# Patient Record
Sex: Female | Born: 1945 | Race: White | Hispanic: No | Marital: Married | Smoking: Former smoker
Health system: Southern US, Community
[De-identification: ages and names within clinical notes are randomized; demographics above are authoritative.]

## PROBLEM LIST (undated history)

## (undated) DIAGNOSIS — F988 Other specified behavioral and emotional disorders with onset usually occurring in childhood and adolescence: Secondary | ICD-10-CM

## (undated) HISTORY — PX: AUGMENTATION MAMMAPLASTY: SUR837

---

## 2000-11-23 ENCOUNTER — Encounter: Payer: Self-pay | Admitting: Internal Medicine

## 2000-11-23 ENCOUNTER — Encounter: Admission: RE | Admit: 2000-11-23 | Discharge: 2000-11-23 | Payer: Self-pay | Admitting: Internal Medicine

## 2003-04-08 ENCOUNTER — Encounter: Payer: Self-pay | Admitting: Plastic Surgery

## 2003-04-08 ENCOUNTER — Encounter: Admission: RE | Admit: 2003-04-08 | Discharge: 2003-04-08 | Payer: Self-pay | Admitting: Plastic Surgery

## 2003-08-21 ENCOUNTER — Ambulatory Visit (HOSPITAL_COMMUNITY): Admission: RE | Admit: 2003-08-21 | Discharge: 2003-08-21 | Payer: Self-pay | Admitting: Internal Medicine

## 2003-08-23 ENCOUNTER — Encounter: Admission: RE | Admit: 2003-08-23 | Discharge: 2003-08-23 | Payer: Self-pay | Admitting: Plastic Surgery

## 2005-01-05 ENCOUNTER — Ambulatory Visit (HOSPITAL_COMMUNITY): Admission: RE | Admit: 2005-01-05 | Discharge: 2005-01-05 | Payer: Self-pay | Admitting: Plastic Surgery

## 2005-08-23 ENCOUNTER — Encounter: Admission: RE | Admit: 2005-08-23 | Discharge: 2005-08-23 | Payer: Self-pay | Admitting: Internal Medicine

## 2006-09-16 ENCOUNTER — Encounter: Admission: RE | Admit: 2006-09-16 | Discharge: 2006-09-16 | Payer: Self-pay | Admitting: Internal Medicine

## 2007-12-08 ENCOUNTER — Encounter: Admission: RE | Admit: 2007-12-08 | Discharge: 2007-12-08 | Payer: Self-pay | Admitting: Internal Medicine

## 2008-11-11 ENCOUNTER — Encounter: Admission: RE | Admit: 2008-11-11 | Discharge: 2008-11-11 | Payer: Self-pay | Admitting: Internal Medicine

## 2008-12-11 ENCOUNTER — Encounter: Admission: RE | Admit: 2008-12-11 | Discharge: 2008-12-11 | Payer: Self-pay | Admitting: Internal Medicine

## 2009-04-21 ENCOUNTER — Ambulatory Visit: Payer: Self-pay | Admitting: Obstetrics and Gynecology

## 2009-04-21 ENCOUNTER — Encounter: Payer: Self-pay | Admitting: Obstetrics and Gynecology

## 2009-04-21 ENCOUNTER — Other Ambulatory Visit: Admission: RE | Admit: 2009-04-21 | Discharge: 2009-04-21 | Payer: Self-pay | Admitting: Obstetrics and Gynecology

## 2010-01-30 ENCOUNTER — Encounter: Admission: RE | Admit: 2010-01-30 | Discharge: 2010-01-30 | Payer: Self-pay | Admitting: Internal Medicine

## 2010-02-10 ENCOUNTER — Encounter: Admission: RE | Admit: 2010-02-10 | Discharge: 2010-02-10 | Payer: Self-pay | Admitting: Internal Medicine

## 2010-06-23 ENCOUNTER — Ambulatory Visit: Payer: Self-pay | Admitting: Obstetrics and Gynecology

## 2010-06-23 ENCOUNTER — Other Ambulatory Visit: Admission: RE | Admit: 2010-06-23 | Discharge: 2010-06-23 | Payer: Self-pay | Admitting: Obstetrics and Gynecology

## 2010-10-04 ENCOUNTER — Encounter: Payer: Self-pay | Admitting: Internal Medicine

## 2010-12-24 ENCOUNTER — Other Ambulatory Visit: Payer: Self-pay | Admitting: Dermatology

## 2011-03-31 ENCOUNTER — Other Ambulatory Visit: Payer: Self-pay | Admitting: Dermatology

## 2011-04-09 ENCOUNTER — Encounter (INDEPENDENT_AMBULATORY_CARE_PROVIDER_SITE_OTHER): Payer: Self-pay

## 2011-06-11 ENCOUNTER — Other Ambulatory Visit: Payer: Self-pay | Admitting: Internal Medicine

## 2011-06-11 DIAGNOSIS — Z1231 Encounter for screening mammogram for malignant neoplasm of breast: Secondary | ICD-10-CM

## 2011-07-26 ENCOUNTER — Ambulatory Visit: Payer: Self-pay

## 2011-10-28 DIAGNOSIS — J069 Acute upper respiratory infection, unspecified: Secondary | ICD-10-CM | POA: Diagnosis not present

## 2011-11-04 ENCOUNTER — Ambulatory Visit
Admission: RE | Admit: 2011-11-04 | Discharge: 2011-11-04 | Disposition: A | Discharge status: Home / Self Care | Payer: Medicare Other | Source: Ambulatory Visit | Attending: Internal Medicine | Admitting: Internal Medicine

## 2011-11-04 DIAGNOSIS — Z1231 Encounter for screening mammogram for malignant neoplasm of breast: Secondary | ICD-10-CM

## 2011-11-19 DIAGNOSIS — M899 Disorder of bone, unspecified: Secondary | ICD-10-CM | POA: Diagnosis not present

## 2011-11-19 DIAGNOSIS — R82998 Other abnormal findings in urine: Secondary | ICD-10-CM | POA: Diagnosis not present

## 2011-11-19 DIAGNOSIS — M949 Disorder of cartilage, unspecified: Secondary | ICD-10-CM | POA: Diagnosis not present

## 2011-11-19 DIAGNOSIS — Z0000 Encounter for general adult medical examination without abnormal findings: Secondary | ICD-10-CM | POA: Diagnosis not present

## 2011-11-26 DIAGNOSIS — Z0000 Encounter for general adult medical examination without abnormal findings: Secondary | ICD-10-CM | POA: Diagnosis not present

## 2011-11-26 DIAGNOSIS — G4700 Insomnia, unspecified: Secondary | ICD-10-CM | POA: Diagnosis not present

## 2011-11-26 DIAGNOSIS — F988 Other specified behavioral and emotional disorders with onset usually occurring in childhood and adolescence: Secondary | ICD-10-CM | POA: Diagnosis not present

## 2011-11-26 DIAGNOSIS — M899 Disorder of bone, unspecified: Secondary | ICD-10-CM | POA: Diagnosis not present

## 2011-11-26 DIAGNOSIS — Z23 Encounter for immunization: Secondary | ICD-10-CM | POA: Diagnosis not present

## 2011-12-03 DIAGNOSIS — Z1212 Encounter for screening for malignant neoplasm of rectum: Secondary | ICD-10-CM | POA: Diagnosis not present

## 2012-05-12 ENCOUNTER — Encounter: Payer: Self-pay | Admitting: Gynecology

## 2012-05-17 ENCOUNTER — Other Ambulatory Visit: Payer: Self-pay | Admitting: Dermatology

## 2012-05-17 DIAGNOSIS — D2370 Other benign neoplasm of skin of unspecified lower limb, including hip: Secondary | ICD-10-CM | POA: Diagnosis not present

## 2012-05-17 DIAGNOSIS — L570 Actinic keratosis: Secondary | ICD-10-CM | POA: Diagnosis not present

## 2012-05-17 DIAGNOSIS — D485 Neoplasm of uncertain behavior of skin: Secondary | ICD-10-CM | POA: Diagnosis not present

## 2012-05-17 DIAGNOSIS — L821 Other seborrheic keratosis: Secondary | ICD-10-CM | POA: Diagnosis not present

## 2012-05-17 DIAGNOSIS — D239 Other benign neoplasm of skin, unspecified: Secondary | ICD-10-CM | POA: Diagnosis not present

## 2012-05-17 DIAGNOSIS — B079 Viral wart, unspecified: Secondary | ICD-10-CM | POA: Diagnosis not present

## 2012-05-18 ENCOUNTER — Encounter: Payer: Self-pay | Admitting: Obstetrics and Gynecology

## 2012-05-18 ENCOUNTER — Ambulatory Visit (INDEPENDENT_AMBULATORY_CARE_PROVIDER_SITE_OTHER): Payer: Medicare Other | Admitting: Obstetrics and Gynecology

## 2012-05-18 VITALS — BP 120/74 | Ht 63.0 in | Wt 130.0 lb

## 2012-05-18 DIAGNOSIS — Z780 Asymptomatic menopausal state: Secondary | ICD-10-CM | POA: Diagnosis not present

## 2012-05-18 DIAGNOSIS — N952 Postmenopausal atrophic vaginitis: Secondary | ICD-10-CM | POA: Diagnosis not present

## 2012-05-18 DIAGNOSIS — K649 Unspecified hemorrhoids: Secondary | ICD-10-CM

## 2012-05-18 NOTE — Progress Notes (Signed)
Patient came back to see me today for further followup. We have been watching her with menopausal symptoms. At the moment she is not taking systemic HRT and really has not having significant vasomotor symptoms which require it. She continues to have some trouble with vaginal dryness but is not using estrogen cream regularly that we've given her. She has been on estrace. There having intercourse several times a week hence she says she gets by without it but she is definitely drier then she would like. She is having no pelvic pain. She does not have vaginal dryness. Her husband is on Viagra now due to ED with hypertensive drugs. She thinks he is not as hard as he used to be and therefore she is having less dyspareunia. She has never had an abnormal Pap smear. She has them yearly. Her last Pap was October, 2011. She is up-to-date on mammograms. She does her  bone densities through her PCP. We have watched her with a hemorrhoid that she has used analpram- HC cream with. She is currently asymptomatic. She is having no rectal bleeding.she does not have any dysuria, urinary urgency, or incontinence.  ROS: 12 system review done. Pertinent positives above.  Physical examination:Kerri Osborne present. HEENT within normal limits. Neck: Thyroid not large. No masses. Supraclavicular nodes: not enlarged. Breasts: Examined in both sitting and lying  position. No skin changes and no masses. Abdomen: Soft no guarding rebound or masses or hernia. Pelvic: External: Within normal limits. BUS: Within normal limits. Vaginal:within normal limits. Poor  estrogen effect. No evidence of cystocele rectocele or enterocele. Cervix: clean. Uterus: Normal size and shape. Adnexa: No masses. Rectovaginal exam: Confirmatory and negative. Extremities: Within normal limits.  Assessment: #1. Atrophic vaginitis #2. Mild menopausal symptoms #3. Asymptomatic hemorrhoid.  Plan: Continue yearly mammograms. Continue periodic bone densities.  Discussed osphenia but for the moment patient will continue Estrace cream.The new Pap smear guidelines were discussed with the patient. No Pap  Done.

## 2012-05-18 NOTE — Patient Instructions (Signed)
Continue yearly mammograms 

## 2012-06-26 ENCOUNTER — Encounter: Payer: Self-pay | Admitting: Gastroenterology

## 2012-09-13 HISTORY — PX: CARPAL TUNNEL RELEASE: SHX101

## 2012-09-13 HISTORY — PX: HALLUX VALGUS CORRECTION: SUR315

## 2012-09-22 DIAGNOSIS — L608 Other nail disorders: Secondary | ICD-10-CM | POA: Diagnosis not present

## 2012-09-22 DIAGNOSIS — D485 Neoplasm of uncertain behavior of skin: Secondary | ICD-10-CM | POA: Diagnosis not present

## 2012-12-05 DIAGNOSIS — L259 Unspecified contact dermatitis, unspecified cause: Secondary | ICD-10-CM | POA: Diagnosis not present

## 2012-12-08 DIAGNOSIS — M949 Disorder of cartilage, unspecified: Secondary | ICD-10-CM | POA: Diagnosis not present

## 2012-12-08 DIAGNOSIS — M899 Disorder of bone, unspecified: Secondary | ICD-10-CM | POA: Diagnosis not present

## 2012-12-15 DIAGNOSIS — Z0000 Encounter for general adult medical examination without abnormal findings: Secondary | ICD-10-CM | POA: Diagnosis not present

## 2012-12-15 DIAGNOSIS — M5430 Sciatica, unspecified side: Secondary | ICD-10-CM | POA: Diagnosis not present

## 2012-12-15 DIAGNOSIS — Z1331 Encounter for screening for depression: Secondary | ICD-10-CM | POA: Diagnosis not present

## 2012-12-15 DIAGNOSIS — E559 Vitamin D deficiency, unspecified: Secondary | ICD-10-CM | POA: Diagnosis not present

## 2012-12-15 DIAGNOSIS — M899 Disorder of bone, unspecified: Secondary | ICD-10-CM | POA: Diagnosis not present

## 2012-12-15 DIAGNOSIS — G4700 Insomnia, unspecified: Secondary | ICD-10-CM | POA: Diagnosis not present

## 2012-12-15 DIAGNOSIS — M949 Disorder of cartilage, unspecified: Secondary | ICD-10-CM | POA: Diagnosis not present

## 2012-12-15 DIAGNOSIS — F988 Other specified behavioral and emotional disorders with onset usually occurring in childhood and adolescence: Secondary | ICD-10-CM | POA: Diagnosis not present

## 2012-12-15 DIAGNOSIS — G5600 Carpal tunnel syndrome, unspecified upper limb: Secondary | ICD-10-CM | POA: Diagnosis not present

## 2012-12-26 DIAGNOSIS — Z1212 Encounter for screening for malignant neoplasm of rectum: Secondary | ICD-10-CM | POA: Diagnosis not present

## 2012-12-29 ENCOUNTER — Other Ambulatory Visit: Payer: Self-pay

## 2012-12-29 DIAGNOSIS — Z1231 Encounter for screening mammogram for malignant neoplasm of breast: Secondary | ICD-10-CM

## 2012-12-29 DIAGNOSIS — Z9882 Breast implant status: Secondary | ICD-10-CM

## 2013-01-15 ENCOUNTER — Encounter: Payer: Self-pay | Admitting: Gastroenterology

## 2013-01-22 ENCOUNTER — Ambulatory Visit: Payer: Medicare Other

## 2013-01-23 DIAGNOSIS — M899 Disorder of bone, unspecified: Secondary | ICD-10-CM | POA: Diagnosis not present

## 2013-02-08 ENCOUNTER — Ambulatory Visit: Payer: Medicare Other

## 2013-02-09 DIAGNOSIS — G5600 Carpal tunnel syndrome, unspecified upper limb: Secondary | ICD-10-CM | POA: Diagnosis not present

## 2013-02-22 DIAGNOSIS — G5600 Carpal tunnel syndrome, unspecified upper limb: Secondary | ICD-10-CM | POA: Diagnosis not present

## 2013-02-26 ENCOUNTER — Ambulatory Visit
Admission: RE | Admit: 2013-02-26 | Discharge: 2013-02-26 | Disposition: A | Discharge status: Home / Self Care | Payer: Medicare Other | Source: Ambulatory Visit

## 2013-02-26 DIAGNOSIS — Z9882 Breast implant status: Secondary | ICD-10-CM

## 2013-02-26 DIAGNOSIS — Z1231 Encounter for screening mammogram for malignant neoplasm of breast: Secondary | ICD-10-CM | POA: Diagnosis not present

## 2013-03-08 ENCOUNTER — Encounter (HOSPITAL_COMMUNITY): Payer: Self-pay | Admitting: Emergency Medicine

## 2013-03-08 ENCOUNTER — Emergency Department (HOSPITAL_COMMUNITY)
Admission: EM | Admit: 2013-03-08 | Discharge: 2013-03-08 | Disposition: A | Discharge status: Home / Self Care | Payer: Medicare Other | Attending: Emergency Medicine | Admitting: Emergency Medicine

## 2013-03-08 ENCOUNTER — Emergency Department (HOSPITAL_COMMUNITY): Payer: Medicare Other

## 2013-03-08 DIAGNOSIS — Z01810 Encounter for preprocedural cardiovascular examination: Secondary | ICD-10-CM | POA: Diagnosis not present

## 2013-03-08 DIAGNOSIS — S90859A Superficial foreign body, unspecified foot, initial encounter: Secondary | ICD-10-CM | POA: Diagnosis not present

## 2013-03-08 DIAGNOSIS — L089 Local infection of the skin and subcutaneous tissue, unspecified: Secondary | ICD-10-CM | POA: Insufficient documentation

## 2013-03-08 DIAGNOSIS — Z8669 Personal history of other diseases of the nervous system and sense organs: Secondary | ICD-10-CM | POA: Insufficient documentation

## 2013-03-08 DIAGNOSIS — Y929 Unspecified place or not applicable: Secondary | ICD-10-CM | POA: Insufficient documentation

## 2013-03-08 DIAGNOSIS — F909 Attention-deficit hyperactivity disorder, unspecified type: Secondary | ICD-10-CM | POA: Insufficient documentation

## 2013-03-08 DIAGNOSIS — Z87891 Personal history of nicotine dependence: Secondary | ICD-10-CM | POA: Insufficient documentation

## 2013-03-08 DIAGNOSIS — Y9301 Activity, walking, marching and hiking: Secondary | ICD-10-CM | POA: Insufficient documentation

## 2013-03-08 DIAGNOSIS — W268XXA Contact with other sharp object(s), not elsewhere classified, initial encounter: Secondary | ICD-10-CM | POA: Insufficient documentation

## 2013-03-08 DIAGNOSIS — M79609 Pain in unspecified limb: Secondary | ICD-10-CM | POA: Diagnosis not present

## 2013-03-08 LAB — CBC
HCT: 49.4 % — ABNORMAL HIGH (ref 36.0–46.0)
Hemoglobin: 13.3 g/dL (ref 12.0–15.0)
MCHC: 26.9 g/dL — ABNORMAL LOW (ref 30.0–36.0)
Platelets: 274 10*3/uL (ref 150–400)
RBC: 4.26 MIL/uL (ref 3.87–5.11)

## 2013-03-08 LAB — BASIC METABOLIC PANEL: GFR calc non Af Amer: >90 mL/min (ref >90)

## 2013-03-08 MED ORDER — CLINDAMYCIN HCL 150 MG PO CAPS: 300.0000 mg | ORAL_CAPSULE | Freq: Four times a day (QID) | ORAL | Status: DC

## 2013-03-08 MED ORDER — CLINDAMYCIN HCL 300 MG PO CAPS
450.0000 mg | ORAL_CAPSULE | Freq: Once | ORAL | Status: AC
  Administered 2013-03-08: 450 mg via ORAL
  Filled 2013-03-08: qty 1

## 2013-03-08 MED ORDER — HYDROCODONE-ACETAMINOPHEN 5-325 MG PO TABS: 1.0000 | ORAL_TABLET | Freq: Four times a day (QID) | ORAL | Status: DC | PRN

## 2013-03-08 NOTE — ED Provider Notes (Signed)
History    CSN: [id number] Arrival date & time 03/08/13  4098 First MD Initiated Contact with Patient 03/08/13 1854     Chief Complaint  Patient presents with  . Toe Injury   HPI  Patient walking across carpet and dirty/rusty carpet tack or needle went into her right great toe last night. Patient with redness expanding up toe today and with moderate pain with walking and severe pain if she pushes on the area. Described as sharp pain. No radiation of pain. Pain improved if area not being pressed on.  Can move toe. No numbness/tingling of toe. No fevers/chills/nausea/vomiting  Past medical history-ADHD, carpal tunnel  Past Surgical History  Procedure Laterality Date  . Cesarean section      X 2  . Augmentation mammaplasty      Implants and implant removal  . Carpal tunnel release     Family History  Problem Relation Age of Onset  . Diabetes Mother    History  Substance Use Topics  . Smoking status: Former Games developer  . Smokeless tobacco: Not on file  . Alcohol Use: 2.5 oz/week    5 drink(s) per week   OB History   Grav Para Term Preterm Abortions TAB SAB Ect Mult Living   2 2 2       2      Review of Systems A full 10 point review of symptoms was performed and was negative except as noted in HPI.   Allergies  Review of patient's allergies indicates no known allergies.  Home Medications   Current Outpatient Rx  Name  Route  Sig  Dispense  Refill  . amphetamine-dextroamphetamine (ADDERALL) 30 MG tablet   Oral   Take 30 mg by mouth every morning.          . calcium-vitamin D (OSCAL WITH D) 500-200 MG-UNIT per tablet   Oral   Take 1 tablet by mouth every morning.         . Cholecalciferol (VITAMIN D PO)   Oral   Take 1 tablet by mouth every morning.          . fish oil-omega-3 fatty acids 1000 MG capsule   Oral   Take 1 g by mouth every morning.         . Multiple Vitamin (MULTIVITAMIN) tablet   Oral   Take 1 tablet by mouth every morning.           . vitamin E 100 UNIT capsule   Oral   Take 100 Units by mouth every morning.          BP 120/76  Pulse 76  Temp(Src) 98 F (36.7 C) (Oral)  Resp 14  Ht 5\' 3"  (1.6 m)  Wt 127 lb (57.607 kg)  BMI 22.5 kg/m2  SpO2 99% Physical Exam  Constitutional: She is oriented to person, place, and time. She appears well-developed and well-nourished.  HENT:  Head: Normocephalic and atraumatic.  Eyes: EOM are normal. Pupils are equal, round, and reactive to light.  Neck: Normal range of motion. Neck supple.  Cardiovascular: Normal rate and regular rhythm.   Pulmonary/Chest: Effort normal and breath sounds normal.  Abdominal: Soft. Bowel sounds are normal.  Musculoskeletal:  Full ROM of great toe. Pinpoint entry wound on medial aspect of right first toe. Very tender to palpation. Surrounding erythema up to web between toes 1+2.   Neurological: She is alert and oriented to person, place, and time. She exhibits normal muscle tone. Coordination normal.  Skin: Skin is warm and dry.    ED Course  Procedures (including critical care time) Labs Reviewed  CBC - Abnormal; Notable for the following:    HCT 49.4 (*)    MCV 116.0 (*)    MCHC 26.9 (*)    All other components within normal limits  BASIC METABOLIC PANEL   Dg Toe Great Right  03/08/2013   *RADIOLOGY REPORT*  Clinical Data: Toe pain, evaluate for foreign body  RIGHT GREAT TOE  Comparison: None.  Findings: 5.5 mm linear metallic radiopacity consistent with the tip of a pin or needle within the medial plantar soft tissues underlying the mid aspect of the distal phalanx of the great toe. The tip of the needle contacts and may be imbedded within the phalanx.  There is associated diffuse soft tissue swelling of the great toe.  The remainder the visualized bones and joints are unremarkable.  IMPRESSION:  A 5.5 mm metallic density consistent with the tip of a pain or needle is imbedded within the medial plantar soft tissues underlying the mid  aspect of the distal phalanx of the great toe.  The tip of the foreign body contacts and may be imbedded within the phalanx.   Original Report Authenticated By: Malachy Moan, M.D.   1. Foreign body in foot/toe-infected, left, initial encounter    MDM  Infected right great toe after rusty nail/pin entered toe with foreign body still in toe. Tetanus up to date (discussed this month with PCP but doesn't remember specific date). Discussed with Dr. Dion Saucier of orthopedics by phone and unable to remove tonight. He will have patient follow up in AM at day surgery center. Patient to take clindamycin for antibiotic coverage. Vicodin prn for pain.   Shelva Majestic, MD 03/08/13 2156

## 2013-03-08 NOTE — ED Notes (Addendum)
Patient reports that last night a pin broke off in her right great toe while she was walking. Affected toe is swollen, red, and hot to touch. No loss of sensation reported, capillary refill is brisk. Patient visited urgent care 1 hour ago and has an x-ray at bedside from the visit.

## 2013-03-08 NOTE — ED Provider Notes (Signed)
I saw and evaluated the patient, reviewed the resident's note and I agree with the findings and plan.   Date: 03/08/2013  Rate: 78  Rhythm: normal sinus rhythm  QRS Axis: normal  Intervals: normal  ST/T Wave abnormalities: normal  Conduction Disutrbances: none  Narrative Interpretation:   Old EKG Reviewed: No significant changes noted  Patient with developing infection in her toe secondary to foreign body.  I discussed the case with orthopedic surgery who will likely operate on the patient tomorrow.  The patient is to be n.p.o. after midnight.  The patient will see Dr Dion Saucier, the surgeon at the Surgical center tomorrow.  Labs and EKG for preop purposes.  Home with antibiotics     Lyanne Co, MD 03/08/13 2336

## 2013-03-09 ENCOUNTER — Encounter (HOSPITAL_BASED_OUTPATIENT_CLINIC_OR_DEPARTMENT_OTHER): Payer: Self-pay | Admitting: Anesthesiology

## 2013-03-09 ENCOUNTER — Ambulatory Visit (HOSPITAL_BASED_OUTPATIENT_CLINIC_OR_DEPARTMENT_OTHER): Payer: Medicare Other | Admitting: Anesthesiology

## 2013-03-09 ENCOUNTER — Encounter (HOSPITAL_BASED_OUTPATIENT_CLINIC_OR_DEPARTMENT_OTHER): Payer: Self-pay | Admitting: *Deleted

## 2013-03-09 ENCOUNTER — Encounter (HOSPITAL_BASED_OUTPATIENT_CLINIC_OR_DEPARTMENT_OTHER)
Admission: RE | Disposition: A | Discharge status: Home / Self Care | Payer: Self-pay | Source: Ambulatory Visit | Attending: Orthopedic Surgery

## 2013-03-09 ENCOUNTER — Ambulatory Visit (HOSPITAL_BASED_OUTPATIENT_CLINIC_OR_DEPARTMENT_OTHER)
Admission: RE | Admit: 2013-03-09 | Discharge: 2013-03-09 | Disposition: A | Discharge status: Home / Self Care | Payer: Medicare Other | Source: Ambulatory Visit | Attending: Orthopedic Surgery | Admitting: Orthopedic Surgery

## 2013-03-09 DIAGNOSIS — S91109A Unspecified open wound of unspecified toe(s) without damage to nail, initial encounter: Secondary | ICD-10-CM | POA: Diagnosis not present

## 2013-03-09 DIAGNOSIS — S90454A Superficial foreign body, right lesser toe(s), initial encounter: Secondary | ICD-10-CM | POA: Diagnosis present

## 2013-03-09 DIAGNOSIS — IMO0002 Reserved for concepts with insufficient information to code with codable children: Secondary | ICD-10-CM | POA: Diagnosis not present

## 2013-03-09 DIAGNOSIS — Z1810 Retained metal fragments, unspecified: Secondary | ICD-10-CM | POA: Diagnosis not present

## 2013-03-09 DIAGNOSIS — S91309A Unspecified open wound, unspecified foot, initial encounter: Secondary | ICD-10-CM | POA: Diagnosis not present

## 2013-03-09 DIAGNOSIS — X58XXXA Exposure to other specified factors, initial encounter: Secondary | ICD-10-CM | POA: Insufficient documentation

## 2013-03-09 DIAGNOSIS — M795 Residual foreign body in soft tissue: Secondary | ICD-10-CM | POA: Insufficient documentation

## 2013-03-09 HISTORY — DX: Other specified behavioral and emotional disorders with onset usually occurring in childhood and adolescence: F98.8

## 2013-03-09 HISTORY — PX: FOREIGN BODY REMOVAL: SHX962

## 2013-03-09 LAB — POCT HEMOGLOBIN-HEMACUE: Hemoglobin: 14.2 g/dL (ref 12.0–15.0)

## 2013-03-09 SURGERY — FOREIGN BODY REMOVAL ADULT
Anesthesia: Monitor Anesthesia Care | Site: Toe | Laterality: Right | Wound class: Dirty or Infected

## 2013-03-09 MED ORDER — MIDAZOLAM HCL 2 MG/2ML IJ SOLN: 0.5000 mg | INTRAMUSCULAR | Status: DC | PRN

## 2013-03-09 MED ORDER — OXYCODONE HCL 5 MG PO TABS: 5.0000 mg | ORAL_TABLET | Freq: Once | ORAL | Status: DC | PRN

## 2013-03-09 MED ORDER — HYDROMORPHONE HCL PF 1 MG/ML IJ SOLN: 0.2500 mg | INTRAMUSCULAR | Status: DC | PRN

## 2013-03-09 MED ORDER — MIDAZOLAM HCL 5 MG/5ML IJ SOLN
INTRAMUSCULAR | Status: DC | PRN
  Administered 2013-03-09: 2 mg via INTRAVENOUS

## 2013-03-09 MED ORDER — LACTATED RINGERS IV SOLN
INTRAVENOUS | Status: DC
  Administered 2013-03-09: 09:00:00 via INTRAVENOUS

## 2013-03-09 MED ORDER — BUPIVACAINE HCL (PF) 0.5 % IJ SOLN
INTRAMUSCULAR | Status: DC | PRN
  Administered 2013-03-09: 10 mL

## 2013-03-09 MED ORDER — FENTANYL CITRATE 0.05 MG/ML IJ SOLN
INTRAMUSCULAR | Status: DC | PRN
  Administered 2013-03-09: 100 ug via INTRAVENOUS

## 2013-03-09 MED ORDER — FENTANYL CITRATE 0.05 MG/ML IJ SOLN: 50.0000 ug | INTRAMUSCULAR | Status: DC | PRN

## 2013-03-09 MED ORDER — PROPOFOL INFUSION 10 MG/ML OPTIME
INTRAVENOUS | Status: DC | PRN
  Administered 2013-03-09: 125 ug/kg/min via INTRAVENOUS

## 2013-03-09 MED ORDER — OXYCODONE HCL 5 MG/5ML PO SOLN: 5.0000 mg | Freq: Once | ORAL | Status: DC | PRN

## 2013-03-09 MED ORDER — ONDANSETRON HCL 4 MG/2ML IJ SOLN
INTRAMUSCULAR | Status: DC | PRN
  Administered 2013-03-09: 4 mg via INTRAVENOUS

## 2013-03-09 MED ORDER — CEFAZOLIN SODIUM-DEXTROSE 2-3 GM-% IV SOLR
INTRAVENOUS | Status: DC | PRN
  Administered 2013-03-09: 2 g via INTRAVENOUS

## 2013-03-09 SURGICAL SUPPLY — 76 items
APL SKNCLS STERI-STRIP NONHPOA (GAUZE/BANDAGES/DRESSINGS)
BANDAGE CONFORM 2  STR LF (GAUZE/BANDAGES/DRESSINGS) ×1 IMPLANT
BANDAGE ELASTIC 3 VELCRO ST LF (GAUZE/BANDAGES/DRESSINGS) ×1 IMPLANT
BANDAGE ELASTIC 4 VELCRO ST LF (GAUZE/BANDAGES/DRESSINGS) IMPLANT
BANDAGE GAUZE ELAST BULKY 4 IN (GAUZE/BANDAGES/DRESSINGS) IMPLANT
BENZOIN TINCTURE PRP APPL 2/3 (GAUZE/BANDAGES/DRESSINGS) IMPLANT
BLADE MINI RND TIP GREEN BEAV (BLADE) IMPLANT
BLADE SURG 15 STRL LF DISP TIS (BLADE) ×1 IMPLANT
BLADE SURG 15 STRL SS (BLADE) ×2
BNDG CMPR 9X4 STRL LF SNTH (GAUZE/BANDAGES/DRESSINGS)
BNDG CMPR MD 5X2 ELC HKLP STRL (GAUZE/BANDAGES/DRESSINGS)
BNDG COHESIVE 4X5 TAN STRL (GAUZE/BANDAGES/DRESSINGS) IMPLANT
BNDG ELASTIC 2 VLCR STRL LF (GAUZE/BANDAGES/DRESSINGS) IMPLANT
BNDG ESMARK 4X9 LF (GAUZE/BANDAGES/DRESSINGS) ×1 IMPLANT
CLOTH BEACON ORANGE TIMEOUT ST (SAFETY) ×2 IMPLANT
CORDS BIPOLAR (ELECTRODE) IMPLANT
COVER TABLE BACK 60X90 (DRAPES) ×2 IMPLANT
CUFF TOURNIQUET SINGLE 18IN (TOURNIQUET CUFF) IMPLANT
CUFF TOURNIQUET SINGLE 34IN LL (TOURNIQUET CUFF) IMPLANT
DECANTER SPIKE VIAL GLASS SM (MISCELLANEOUS) IMPLANT
DRAPE EXTREMITY T 121X128X90 (DRAPE) ×2 IMPLANT
DRAPE OEC MINIVIEW 54X84 (DRAPES) IMPLANT
DRAPE U 20/CS (DRAPES) ×2 IMPLANT
DURAPREP 26ML APPLICATOR (WOUND CARE) ×2 IMPLANT
ELECT REM PT RETURN 9FT ADLT (ELECTROSURGICAL)
ELECTRODE REM PT RTRN 9FT ADLT (ELECTROSURGICAL) ×1 IMPLANT
GAUZE XEROFORM 1X8 LF (GAUZE/BANDAGES/DRESSINGS) ×1 IMPLANT
GLOVE BIO SURGEON STRL SZ7 (GLOVE) ×1 IMPLANT
GLOVE BIO SURGEON STRL SZ7.5 (GLOVE) IMPLANT
GLOVE BIOGEL PI IND STRL 7.0 (GLOVE) IMPLANT
GLOVE BIOGEL PI IND STRL 8 (GLOVE) ×2 IMPLANT
GLOVE BIOGEL PI INDICATOR 7.0 (GLOVE) ×2
GLOVE BIOGEL PI INDICATOR 8 (GLOVE) ×2
GLOVE ORTHO TXT STRL SZ7.5 (GLOVE) ×2 IMPLANT
GLOVE SURG ORTHO 8.0 STRL STRW (GLOVE) ×2 IMPLANT
GLOVE SURG SS PI 7.0 STRL IVOR (GLOVE) ×1 IMPLANT
GOWN BRE IMP PREV XXLGXLNG (GOWN DISPOSABLE) ×4 IMPLANT
NDL HYPO 25X1 1.5 SAFETY (NEEDLE) IMPLANT
NEEDLE HYPO 25X1 1.5 SAFETY (NEEDLE) ×2 IMPLANT
NS IRRIG 1000ML POUR BTL (IV SOLUTION) ×2 IMPLANT
PACK BASIN DAY SURGERY FS (CUSTOM PROCEDURE TRAY) ×2 IMPLANT
PAD CAST 3X4 CTTN HI CHSV (CAST SUPPLIES) IMPLANT
PAD CAST 4YDX4 CTTN HI CHSV (CAST SUPPLIES) IMPLANT
PADDING CAST ABS 4INX4YD NS (CAST SUPPLIES) ×1
PADDING CAST ABS COTTON 4X4 ST (CAST SUPPLIES) ×1 IMPLANT
PADDING CAST COTTON 3X4 STRL (CAST SUPPLIES)
PADDING CAST COTTON 4X4 STRL (CAST SUPPLIES)
PADDING CAST SYN 6 (CAST SUPPLIES)
PADDING CAST SYNTHETIC 2 (CAST SUPPLIES)
PADDING CAST SYNTHETIC 2X4 NS (CAST SUPPLIES) IMPLANT
PADDING CAST SYNTHETIC 3 NS LF (CAST SUPPLIES)
PADDING CAST SYNTHETIC 3X4 NS (CAST SUPPLIES) IMPLANT
PADDING CAST SYNTHETIC 4 (CAST SUPPLIES)
PADDING CAST SYNTHETIC 4X4 STR (CAST SUPPLIES) IMPLANT
PADDING CAST SYNTHETIC 6X4 NS (CAST SUPPLIES) IMPLANT
PADDING UNDERCAST 2  STERILE (CAST SUPPLIES) IMPLANT
PENCIL BUTTON HOLSTER BLD 10FT (ELECTRODE) IMPLANT
SCOTCHCAST PLUS 2X4 WHITE (CAST SUPPLIES) IMPLANT
SCOTCHCAST PLUS 3X4 WHITE (CAST SUPPLIES) IMPLANT
SCOTCHCAST PLUS 4X4 WHITE (CAST SUPPLIES) IMPLANT
SCOTCHCAST PLUS 5X4 WHITE (CAST SUPPLIES) IMPLANT
SPONGE GAUZE 4X4 12PLY (GAUZE/BANDAGES/DRESSINGS) ×2 IMPLANT
STOCKINETTE 4X48 STRL (DRAPES) IMPLANT
STOCKINETTE 6  STRL (DRAPES) ×1
STOCKINETTE 6 STRL (DRAPES) IMPLANT
STRIP CLOSURE SKIN 1/2X4 (GAUZE/BANDAGES/DRESSINGS) IMPLANT
SUT ETHILON 3 0 PS 1 (SUTURE) ×1 IMPLANT
SUT ETHILON 4 0 PS 2 18 (SUTURE) IMPLANT
SUT MNCRL AB 4-0 PS2 18 (SUTURE) IMPLANT
SUT VIC AB 3-0 SH 27 (SUTURE)
SUT VIC AB 3-0 SH 27X BRD (SUTURE) IMPLANT
SUT VICRYL 3-0 CR8 SH (SUTURE) ×1 IMPLANT
SYR BULB 3OZ (MISCELLANEOUS) ×2 IMPLANT
SYR CONTROL 10ML LL (SYRINGE) ×1 IMPLANT
TOWEL OR 17X24 6PK STRL BLUE (TOWEL DISPOSABLE) ×2 IMPLANT
UNDERPAD 30X30 INCONTINENT (UNDERPADS AND DIAPERS) ×2 IMPLANT

## 2013-03-09 NOTE — Anesthesia Postprocedure Evaluation (Signed)
  Anesthesia Post-op Note  Patient: Kerri Osborne  Procedure(s) Performed: Procedure(s): FOREIGN BODY REMOVAL ADULT I and  D abcess Right great toe  (Right)  Patient Location: PACU  Anesthesia Type:MAC  Level of Consciousness: awake, alert  and oriented  Airway and Oxygen Therapy: Patient Spontanous Breathing  Post-op Pain: none  Post-op Assessment: Post-op Vital signs reviewed, Patient's Cardiovascular Status Stable, Respiratory Function Stable, Patent Airway and No signs of Nausea or vomiting  Post-op Vital Signs: Reviewed and stable  Complications: No apparent anesthesia complications

## 2013-03-09 NOTE — H&P (Signed)
PREOPERATIVE H&P  Chief Complaint: peice of metal in great right toe   HPI: Kerri Osborne is a 67 y.o. female who presents for preoperative history and physical with a diagnosis of peice of metal in great right toe . Symptoms are rated as moderate to severe, and have been worsening.  This is significantly impairing activities of daily living.  She has elected for surgical management.   She was seen last night in ED.  Worsening redness and pain.  This happened 2 days ago.    Past Medical History  Diagnosis Date  . ADD (attention deficit disorder)    Past Surgical History  Procedure Laterality Date  . Cesarean section      X 2  . Augmentation mammaplasty      Implants and implant removal  . Carpal tunnel release    . Hallux valgus correction      rt   History   Social History  . Marital Status: Married    Spouse Name: N/A    Number of Children: N/A  . Years of Education: N/A   Social History Main Topics  . Smoking status: Former Games developer  . Smokeless tobacco: None  . Alcohol Use: 2.5 oz/week    5 drink(s) per week     Comment: social  . Drug Use: None  . Sexually Active: Yes    Birth Control/ Protection: Post-menopausal   Other Topics Concern  . None   Social History Narrative  . None   Family History  Problem Relation Age of Onset  . Diabetes Mother    No Known Allergies Prior to Admission medications   Medication Sig Start Date End Date Taking? Authorizing Provider  amphetamine-dextroamphetamine (ADDERALL) 30 MG tablet Take 30 mg by mouth every morning.    Yes Historical Provider, MD  calcium-vitamin D (OSCAL WITH D) 500-200 MG-UNIT per tablet Take 1 tablet by mouth every morning.   Yes Historical Provider, MD  Cholecalciferol (VITAMIN D PO) Take 1 tablet by mouth every morning.    Yes Historical Provider, MD  clindamycin (CLEOCIN) 150 MG capsule Take 2 capsules (300 mg total) by mouth 4 (four) times daily. 03/08/13  Yes Shelva Majestic, MD  fish oil-omega-3  fatty acids 1000 MG capsule Take 1 g by mouth every morning.   Yes Historical Provider, MD  Multiple Vitamin (MULTIVITAMIN) tablet Take 1 tablet by mouth every morning.    Yes Historical Provider, MD  vitamin E 100 UNIT capsule Take 100 Units by mouth every morning.   Yes Historical Provider, MD  HYDROcodone-acetaminophen (NORCO/VICODIN) 5-325 MG per tablet Take 1 tablet by mouth every 6 (six) hours as needed for pain. 03/08/13   Shelva Majestic, MD     Positive ROS: All other systems have been reviewed and were otherwise negative with the exception of those mentioned in the HPI and as above.  Physical Exam: General: Alert, no acute distress Cardiovascular: No pedal edema Respiratory: No cyanosis, no use of accessory musculature GI: No organomegaly, abdomen is soft and non-tender Skin: No lesions in the area of chief complaint Neurologic: Sensation intact distally Psychiatric: Patient is competent for consent with normal mood and affect Lymphatic: No axillary or cervical lymphadenopathy  MUSCULOSKELETAL: right great toe is red with pain to palpation and punctate entrance wound.  Assessment: peice of metal in great right toe   Plan: Plan for Procedure(s): FOREIGN BODY REMOVAL ADULT I&D Right great toe   The risks benefits and alternatives were discussed with the  patient including but not limited to the risks of nonoperative treatment, versus surgical intervention including infection, bleeding, nerve injury,  blood clots, cardiopulmonary complications, morbidity, mortality, among others, and they were willing to proceed.   The broken off piece is rusty and grossly dirty.    Eulas Post, MD Cell [phone]   03/09/2013 9:21 AM  '

## 2013-03-09 NOTE — Transfer of Care (Signed)
Immediate Anesthesia Transfer of Care Note  Patient: Kerri Osborne  Procedure(s) Performed: Procedure(s): FOREIGN BODY REMOVAL ADULT I and  D abcess Right great toe  (Right)  Patient Location: PACU  Anesthesia Type:MAC  Level of Consciousness: awake, alert , oriented and patient cooperative  Airway & Oxygen Therapy: Patient Spontanous Breathing and Patient connected to face mask oxygen  Post-op Assessment: Report given to PACU RN and Post -op Vital signs reviewed and stable  Post vital signs: Reviewed and stable  Complications: No apparent anesthesia complications

## 2013-03-09 NOTE — Anesthesia Preprocedure Evaluation (Addendum)
Anesthesia Evaluation  Patient identified by MRN, date of birth, ID band Patient awake    Reviewed: Allergy & Precautions, H&P , NPO status , Patient's Chart, lab work & pertinent test results  Airway Mallampati: II TM Distance: >3 FB Neck ROM: Full    Dental no notable dental hx. (+) Teeth Intact and Dental Advisory Given   Pulmonary neg pulmonary ROS,  breath sounds clear to auscultation  Pulmonary exam normal       Cardiovascular negative cardio ROS  Rhythm:Regular Rate:Normal     Neuro/Psych PSYCHIATRIC DISORDERS ADDnegative neurological ROS     GI/Hepatic negative GI ROS, Neg liver ROS,   Endo/Other  negative endocrine ROS  Renal/GU negative Renal ROS  negative genitourinary   Musculoskeletal   Abdominal   Peds  Hematology negative hematology ROS (+)   Anesthesia Other Findings   Reproductive/Obstetrics negative OB ROS                          Anesthesia Physical Anesthesia Plan  ASA: II  Anesthesia Plan: MAC   Post-op Pain Management:    Induction: Intravenous  Airway Management Planned: Simple Face Mask  Additional Equipment:   Intra-op Plan:   Post-operative Plan: Extubation in OR  Informed Consent: I have reviewed the patients History and Physical, chart, labs and discussed the procedure including the risks, benefits and alternatives for the proposed anesthesia with the patient or authorized representative who has indicated his/her understanding and acceptance.   Dental advisory given  Plan Discussed with: CRNA  Anesthesia Plan Comments:         Anesthesia Quick Evaluation

## 2013-03-09 NOTE — Op Note (Signed)
03/09/2013  11:01 AM  PATIENT:  Kerri Osborne    PRE-OPERATIVE DIAGNOSIS:  peice of metal in great right toe with infection  POST-OPERATIVE DIAGNOSIS:  Same  PROCEDURE:  FOREIGN BODY REMOVAL, DEEP WITH IRRIGATION AND DEBRIDEMENT, SKIN AND SUBCUTANEOUS TISSUE RIGHT GREAT TOE   SURGEON:  Eulas Post, MD  PHYSICIAN ASSISTANT: Janace Litten, OPA-C, present and scrubbed throughout the case, critical for completion in a timely fashion, and for retraction, instrumentation, and closure.  ANESTHESIA:   General  PREOPERATIVE INDICATIONS:  Kerri Osborne is a  67 y.o. female with a diagnosis of peice of metal in great right toe  who failed conservative measures and elected for surgical management.  She had increasing redness around her foot, was seen in the emergency room, and referred to me for evaluation. She had a retained broken metallic rested piece of needle stuck in the tibial side of her great toe.  The risks benefits and alternatives were discussed with the patient preoperatively including but not limited to the risks of infection, bleeding, nerve injury, cardiopulmonary complications, the need for revision surgery, among others, and the patient was willing to proceed.  OPERATIVE IMPLANTS: I removed a metallic piece of needle that was dirty and rested  OPERATIVE FINDINGS: Retained metallic piece in the toe with surrounding erythema, no pus.  OPERATIVE PROCEDURE: The patient is brought to the operating room and placed in the supine position. IV Ancef was given. The right lower extremity was prepped with DuraPrep, and a local block was administered with monitored anesthesia care. Half percent Marcaine was injected in the toe, no epinephrine. Time out was performed. Medial incision was made at the location of the puncture wound, and dissection was carried down and the needle tip was identified and removed. This was not easy to find, but ultimately I was able to secure the needle. I irrigated  the wounds copiously, and repaired the incision with a single nylon stitch.  Sterile gauze followed by a postop shoe was applied. She was awakened and returned to the PACU in stable and satisfactory condition. There were no complications.

## 2013-03-09 NOTE — Anesthesia Procedure Notes (Signed)
Procedure Name: MAC Date/Time: 03/09/2013 10:44 AM Performed by: Verlan Friends Pre-anesthesia Checklist: Patient identified, Timeout performed, Emergency Drugs available, Suction available and Patient being monitored Patient Re-evaluated:Patient Re-evaluated prior to inductionOxygen Delivery Method: Simple face mask Placement Confirmation: positive ETCO2

## 2013-03-12 ENCOUNTER — Encounter (HOSPITAL_BASED_OUTPATIENT_CLINIC_OR_DEPARTMENT_OTHER): Payer: Self-pay | Admitting: Orthopedic Surgery

## 2013-03-14 DIAGNOSIS — G5600 Carpal tunnel syndrome, unspecified upper limb: Secondary | ICD-10-CM | POA: Diagnosis not present

## 2013-03-14 DIAGNOSIS — Z4789 Encounter for other orthopedic aftercare: Secondary | ICD-10-CM | POA: Diagnosis not present

## 2013-03-25 DIAGNOSIS — W19XXXA Unspecified fall, initial encounter: Secondary | ICD-10-CM | POA: Diagnosis not present

## 2013-03-25 DIAGNOSIS — S7010XA Contusion of unspecified thigh, initial encounter: Secondary | ICD-10-CM | POA: Diagnosis not present

## 2013-03-25 DIAGNOSIS — M79609 Pain in unspecified limb: Secondary | ICD-10-CM | POA: Diagnosis not present

## 2013-03-25 DIAGNOSIS — T148XXA Other injury of unspecified body region, initial encounter: Secondary | ICD-10-CM | POA: Diagnosis not present

## 2013-05-17 DIAGNOSIS — D485 Neoplasm of uncertain behavior of skin: Secondary | ICD-10-CM | POA: Diagnosis not present

## 2013-05-17 DIAGNOSIS — L821 Other seborrheic keratosis: Secondary | ICD-10-CM | POA: Diagnosis not present

## 2013-05-17 DIAGNOSIS — L819 Disorder of pigmentation, unspecified: Secondary | ICD-10-CM | POA: Diagnosis not present

## 2013-05-17 DIAGNOSIS — D239 Other benign neoplasm of skin, unspecified: Secondary | ICD-10-CM | POA: Diagnosis not present

## 2013-05-17 DIAGNOSIS — D234 Other benign neoplasm of skin of scalp and neck: Secondary | ICD-10-CM | POA: Diagnosis not present

## 2013-05-17 DIAGNOSIS — L851 Acquired keratosis [keratoderma] palmaris et plantaris: Secondary | ICD-10-CM | POA: Diagnosis not present

## 2013-05-17 DIAGNOSIS — D235 Other benign neoplasm of skin of trunk: Secondary | ICD-10-CM | POA: Diagnosis not present

## 2013-11-28 DIAGNOSIS — L570 Actinic keratosis: Secondary | ICD-10-CM | POA: Diagnosis not present

## 2013-12-24 DIAGNOSIS — R82998 Other abnormal findings in urine: Secondary | ICD-10-CM | POA: Diagnosis not present

## 2013-12-24 DIAGNOSIS — E559 Vitamin D deficiency, unspecified: Secondary | ICD-10-CM | POA: Diagnosis not present

## 2013-12-24 DIAGNOSIS — M899 Disorder of bone, unspecified: Secondary | ICD-10-CM | POA: Diagnosis not present

## 2013-12-24 DIAGNOSIS — M949 Disorder of cartilage, unspecified: Secondary | ICD-10-CM | POA: Diagnosis not present

## 2013-12-31 ENCOUNTER — Encounter: Payer: Self-pay | Admitting: Internal Medicine

## 2013-12-31 DIAGNOSIS — F988 Other specified behavioral and emotional disorders with onset usually occurring in childhood and adolescence: Secondary | ICD-10-CM | POA: Diagnosis not present

## 2013-12-31 DIAGNOSIS — G4700 Insomnia, unspecified: Secondary | ICD-10-CM | POA: Diagnosis not present

## 2013-12-31 DIAGNOSIS — E559 Vitamin D deficiency, unspecified: Secondary | ICD-10-CM | POA: Diagnosis not present

## 2013-12-31 DIAGNOSIS — Z1212 Encounter for screening for malignant neoplasm of rectum: Secondary | ICD-10-CM | POA: Diagnosis not present

## 2013-12-31 DIAGNOSIS — Z0000 Encounter for general adult medical examination without abnormal findings: Secondary | ICD-10-CM | POA: Diagnosis not present

## 2013-12-31 DIAGNOSIS — M5430 Sciatica, unspecified side: Secondary | ICD-10-CM | POA: Diagnosis not present

## 2013-12-31 DIAGNOSIS — M899 Disorder of bone, unspecified: Secondary | ICD-10-CM | POA: Diagnosis not present

## 2013-12-31 DIAGNOSIS — M949 Disorder of cartilage, unspecified: Secondary | ICD-10-CM | POA: Diagnosis not present

## 2013-12-31 DIAGNOSIS — G5600 Carpal tunnel syndrome, unspecified upper limb: Secondary | ICD-10-CM | POA: Diagnosis not present

## 2014-02-03 DIAGNOSIS — J069 Acute upper respiratory infection, unspecified: Secondary | ICD-10-CM | POA: Diagnosis not present

## 2014-02-05 ENCOUNTER — Telehealth: Payer: Self-pay | Admitting: *Deleted

## 2014-02-05 ENCOUNTER — Ambulatory Visit (AMBULATORY_SURGERY_CENTER): Payer: Self-pay | Admitting: *Deleted

## 2014-02-05 VITALS — Ht 62.75 in | Wt 131.2 lb

## 2014-02-05 DIAGNOSIS — K921 Melena: Secondary | ICD-10-CM

## 2014-02-05 MED ORDER — MOVIPREP 100 G PO SOLR: ORAL | Status: DC

## 2014-02-05 NOTE — Telephone Encounter (Signed)
Pt scheduled for direct colonoscopy with Dr Henrene Pastor Thursday 02/21/2014.  Pt referred by Dr Virgina Jock for hem + stools.  Last colonoscopy 6 to 7 years ago at [facility].  Pt says she does not think she had polyps.  Release of information form signed and given to Julieanne Cotton.

## 2014-02-05 NOTE — Progress Notes (Signed)
No allergies to eggs or soy. No problems with anesthesia.  Pt given Emmi instructions for colonoscopy  No oxygen use  No diet drug use  

## 2014-02-18 ENCOUNTER — Telehealth: Payer: Self-pay | Admitting: Internal Medicine

## 2014-02-18 NOTE — Telephone Encounter (Signed)
Called patient back, no answer. Will try again later.  

## 2014-02-18 NOTE — Telephone Encounter (Signed)
Spoke with patient. Prep cost $90, explained to patient she could come by 4 th floor to pick up free sample of MoviPrep. Patient verbalizes understanding.  Prep at desk.

## 2014-02-19 ENCOUNTER — Telehealth: Payer: Self-pay

## 2014-02-19 NOTE — Telephone Encounter (Signed)
In that case, yes she should keep plans for colonoscopy on June 11.

## 2014-02-19 NOTE — Telephone Encounter (Signed)
Pt is scheduled for colon 02/21/14. Dr. Henrene Pastor reviewed chart and last colon done in 2009 showed no polyps. Pt does have a first degree relative that had history of colon cancer. Recall should be 10/2017 unless pt has problems. Pt states the colon was scheduled by PCP because there was blood in her stool when she did stool cards. Pt states she was constipated and there was a little blood that she saw but she has a history of fissures. Pt states she probably should have waited longer than she did to complete the cards and perhaps they would have been negative. Please advise if pt should keep appt for colon 02/21/14.

## 2014-02-19 NOTE — Telephone Encounter (Signed)
Spoke with pt and she knows to keep the colonoscopy as scheduled.

## 2014-02-21 ENCOUNTER — Encounter: Payer: Self-pay | Admitting: Internal Medicine

## 2014-02-21 ENCOUNTER — Ambulatory Visit (AMBULATORY_SURGERY_CENTER): Payer: Medicare Other | Admitting: Internal Medicine

## 2014-02-21 VITALS — BP 103/56 | HR 61 | Temp 96.8°F | Resp 15 | Ht 62.75 in | Wt 131.0 lb

## 2014-02-21 DIAGNOSIS — K921 Melena: Secondary | ICD-10-CM

## 2014-02-21 DIAGNOSIS — F959 Tic disorder, unspecified: Secondary | ICD-10-CM | POA: Diagnosis not present

## 2014-02-21 DIAGNOSIS — F988 Other specified behavioral and emotional disorders with onset usually occurring in childhood and adolescence: Secondary | ICD-10-CM | POA: Diagnosis not present

## 2014-02-21 MED ORDER — SODIUM CHLORIDE 0.9 % IV SOLN: 500.0000 mL | INTRAVENOUS | Status: DC

## 2014-02-21 NOTE — Patient Instructions (Addendum)
YOU HAD AN ENDOSCOPIC PROCEDURE TODAY AT [facility]: Refer to the procedure report that was given to you for any specific questions about what was found during the examination.  If the procedure report does not answer your questions, please call your gastroenterologist to clarify.  If you requested that your care partner not be given the details of your procedure findings, then the procedure report has been included in a sealed envelope for you to review at your convenience later.  YOU SHOULD EXPECT: Some feelings of bloating in the abdomen. Passage of more gas than usual.  Walking can help get rid of the air that was put into your GI tract during the procedure and reduce the bloating. If you had a lower endoscopy (such as a colonoscopy or flexible sigmoidoscopy) you may notice spotting of blood in your stool or on the toilet paper. If you underwent a bowel prep for your procedure, then you may not have a normal bowel movement for a few days.  DIET: Your first meal following the procedure should be a light meal and then it is ok to progress to your normal diet.  A half-sandwich or bowl of soup is an example of a good first meal.  Heavy or fried foods are harder to digest and may make you feel nauseous or bloated.  Likewise meals heavy in dairy and vegetables can cause extra gas to form and this can also increase the bloating.  Drink plenty of fluids but you should avoid alcoholic beverages for 24 hours.  Try to increase the fiber in your diet. That will help with your hemorrhoids and Diverticulosis.  ACTIVITY: Your care partner should take you home directly after the procedure.  You should plan to take it easy, moving slowly for the rest of the day.  You can resume normal activity the day after the procedure however you should NOT DRIVE or use heavy machinery for 24 hours (because of the sedation medicines used during the test).    SYMPTOMS TO REPORT IMMEDIATELY: A gastroenterologist can  be reached at any hour.  During normal business hours, 8:30 AM to 5:00 PM Monday through Friday, call [phone].  After hours and on weekends, please call the GI answering service at [phone] who will take a message and have the physician on call contact you.   Following lower endoscopy (colonoscopy or flexible sigmoidoscopy):  Excessive amounts of blood in the stool  Significant tenderness or worsening of abdominal pains  Swelling of the abdomen that is new, acute  Fever of 100F or higher  FOLLOW UP: If any biopsies were taken you will be contacted by phone or by letter within the next 1-3 weeks.  Call your gastroenterologist if you have not heard about the biopsies in 3 weeks.  Our staff will call the home number listed on your records the next business day following your procedure to check on you and address any questions or concerns that you may have at that time regarding the information given to you following your procedure. This is a courtesy call and so if there is no answer at the home number and we have not heard from you through the emergency physician on call, we will assume that you have returned to your regular daily activities without incident.  SIGNATURES/CONFIDENTIALITY: You and/or your care partner have signed paperwork which will be entered into your electronic medical record.  These signatures attest to the fact that that the information above on your  After Visit Summary has been reviewed and is understood.  Full responsibility of the confidentiality of this discharge information lies with you and/or your care-partner.

## 2014-02-21 NOTE — Op Note (Signed)
[place]  [address] [place], [id number]   COLONOSCOPY PROCEDURE REPORT  PATIENT: Kerri Osborne, Kerri Osborne  MR#: [id number] BIRTHDATE: 1946-02-08 , 89  yrs. old GENDER: Female ENDOSCOPIST: Eustace Quail, MD REFERRED DG:LOVF Virgina Jock, M.D. PROCEDURE DATE:  02/21/2014 PROCEDURE:   Colonoscopy, diagnostic First Screening Colonoscopy - Avg.  risk and is 50 yrs.  old or older - No.  Prior Negative Screening - Now for repeat screening. N/A  History of Adenoma - Now for follow-up colonoscopy & has been > or = to 3 yrs.  N/A  Polyps Removed Today? No.  Recommend repeat exam, <10 yrs? No. ASA CLASS:   Class II INDICATIONS:heme-positive stool.   Negative exam 2009 MEDICATIONS: MAC sedation, administered by CRNA and propofol (Diprivan) 380mg  IV  DESCRIPTION OF PROCEDURE:   After the risks benefits and alternatives of the procedure were thoroughly explained, informed consent was obtained.  A digital rectal exam revealed no abnormalities of the rectum.   The LB [id number]  endoscope was introduced through the anus and advanced to the cecum, which was identified by both the appendix and ileocecal valve. No adverse events experienced.   The quality of the prep was excellent, using MoviPrep  The instrument was then slowly withdrawn as the colon was fully examined.  COLON FINDINGS: Moderate diverticulosis was noted throughout the entire examined colon.   The colon was otherwise normal.  There was no  inflammation, polyps or cancers unless previously stated. Retroflexed views revealed internal hemorrhoids. The time to cecum=5 minutes 19 seconds.  Withdrawal time=15 minutes 27 seconds. The scope was withdrawn and the procedure completed. COMPLICATIONS: There were no complications.  ENDOSCOPIC IMPRESSION: 1.   Moderate diverticulosis was noted throughout the entire examined colon 2.   The colon was otherwise normal  RECOMMENDATIONS: 1. Continue current colorectal  screening recommendations for "routine risk" patients with a repeat colonoscopy in 10 years.   eSigned:  Eustace Quail, MD 02/21/2014 10:59 AM   cc: Shon Baton, MD and The Patient

## 2014-02-21 NOTE — Progress Notes (Signed)
Report to PACU, RN, vss, BBS= Clear.  

## 2014-02-22 ENCOUNTER — Telehealth: Payer: Self-pay | Admitting: *Deleted

## 2014-02-22 NOTE — Telephone Encounter (Signed)
  Follow up Call-  Call back number 02/21/2014  Post procedure Call Back phone  # [phone]  Permission to leave phone message Yes     Patient questions:  Do you have a fever, pain , or abdominal swelling? no Pain Score  0 *  Have you tolerated food without any problems? yes  Have you been able to return to your normal activities? yes  Do you have any questions about your discharge instructions: Diet   no Medications  no Follow up visit  no  Do you have questions or concerns about your Care? no  Actions: * If pain score is 4 or above: No action needed, pain <4.

## 2014-03-28 ENCOUNTER — Other Ambulatory Visit: Payer: Self-pay

## 2014-03-28 DIAGNOSIS — Z1231 Encounter for screening mammogram for malignant neoplasm of breast: Secondary | ICD-10-CM

## 2014-04-10 ENCOUNTER — Other Ambulatory Visit: Payer: Self-pay

## 2014-04-10 ENCOUNTER — Ambulatory Visit: Payer: Medicare Other

## 2014-04-10 DIAGNOSIS — Z1231 Encounter for screening mammogram for malignant neoplasm of breast: Secondary | ICD-10-CM

## 2014-04-10 DIAGNOSIS — Z9886 Personal history of breast implant removal: Secondary | ICD-10-CM

## 2014-04-16 ENCOUNTER — Ambulatory Visit: Payer: Medicare Other

## 2014-06-21 ENCOUNTER — Ambulatory Visit
Admission: RE | Admit: 2014-06-21 | Discharge: 2014-06-21 | Disposition: A | Discharge status: Home / Self Care | Payer: Medicare Other | Source: Ambulatory Visit

## 2014-06-21 DIAGNOSIS — Z9886 Personal history of breast implant removal: Secondary | ICD-10-CM

## 2014-06-21 DIAGNOSIS — Z1231 Encounter for screening mammogram for malignant neoplasm of breast: Secondary | ICD-10-CM | POA: Diagnosis not present

## 2014-06-27 DIAGNOSIS — L700 Acne vulgaris: Secondary | ICD-10-CM | POA: Diagnosis not present

## 2014-06-27 DIAGNOSIS — D225 Melanocytic nevi of trunk: Secondary | ICD-10-CM | POA: Diagnosis not present

## 2014-06-27 DIAGNOSIS — L821 Other seborrheic keratosis: Secondary | ICD-10-CM | POA: Diagnosis not present

## 2014-06-27 DIAGNOSIS — L814 Other melanin hyperpigmentation: Secondary | ICD-10-CM | POA: Diagnosis not present

## 2014-06-27 DIAGNOSIS — L438 Other lichen planus: Secondary | ICD-10-CM | POA: Diagnosis not present

## 2014-07-15 ENCOUNTER — Encounter: Payer: Self-pay | Admitting: Internal Medicine

## 2014-12-31 DIAGNOSIS — N390 Urinary tract infection, site not specified: Secondary | ICD-10-CM | POA: Diagnosis not present

## 2014-12-31 DIAGNOSIS — Z0000 Encounter for general adult medical examination without abnormal findings: Secondary | ICD-10-CM | POA: Diagnosis not present

## 2014-12-31 DIAGNOSIS — E559 Vitamin D deficiency, unspecified: Secondary | ICD-10-CM | POA: Diagnosis not present

## 2014-12-31 DIAGNOSIS — G5601 Carpal tunnel syndrome, right upper limb: Secondary | ICD-10-CM | POA: Diagnosis not present

## 2015-01-07 DIAGNOSIS — F900 Attention-deficit hyperactivity disorder, predominantly inattentive type: Secondary | ICD-10-CM | POA: Diagnosis not present

## 2015-01-07 DIAGNOSIS — M8580 Other specified disorders of bone density and structure, unspecified site: Secondary | ICD-10-CM | POA: Diagnosis not present

## 2015-01-07 DIAGNOSIS — Z0000 Encounter for general adult medical examination without abnormal findings: Secondary | ICD-10-CM | POA: Diagnosis not present

## 2015-01-07 DIAGNOSIS — Z23 Encounter for immunization: Secondary | ICD-10-CM | POA: Diagnosis not present

## 2015-01-07 DIAGNOSIS — Z780 Asymptomatic menopausal state: Secondary | ICD-10-CM | POA: Diagnosis not present

## 2015-01-07 DIAGNOSIS — R002 Palpitations: Secondary | ICD-10-CM | POA: Diagnosis not present

## 2015-01-07 DIAGNOSIS — Z1389 Encounter for screening for other disorder: Secondary | ICD-10-CM | POA: Diagnosis not present

## 2015-01-07 DIAGNOSIS — G4700 Insomnia, unspecified: Secondary | ICD-10-CM | POA: Diagnosis not present

## 2015-01-07 DIAGNOSIS — E559 Vitamin D deficiency, unspecified: Secondary | ICD-10-CM | POA: Diagnosis not present

## 2015-01-07 DIAGNOSIS — A6000 Herpesviral infection of urogenital system, unspecified: Secondary | ICD-10-CM | POA: Diagnosis not present

## 2015-01-10 DIAGNOSIS — Z1212 Encounter for screening for malignant neoplasm of rectum: Secondary | ICD-10-CM | POA: Diagnosis not present

## 2015-01-24 DIAGNOSIS — M25532 Pain in left wrist: Secondary | ICD-10-CM | POA: Diagnosis not present

## 2015-01-24 DIAGNOSIS — Z6822 Body mass index (BMI) 22.0-22.9, adult: Secondary | ICD-10-CM | POA: Diagnosis not present

## 2015-01-29 ENCOUNTER — Other Ambulatory Visit: Payer: Self-pay | Admitting: Internal Medicine

## 2015-01-29 ENCOUNTER — Other Ambulatory Visit: Payer: Self-pay | Admitting: *Deleted

## 2015-01-29 ENCOUNTER — Ambulatory Visit
Admission: RE | Admit: 2015-01-29 | Discharge: 2015-01-29 | Disposition: A | Discharge status: Home / Self Care | Payer: Medicare Other | Source: Ambulatory Visit | Attending: *Deleted | Admitting: *Deleted

## 2015-01-29 DIAGNOSIS — R8290 Unspecified abnormal findings in urine: Secondary | ICD-10-CM | POA: Diagnosis not present

## 2015-01-29 DIAGNOSIS — Z6822 Body mass index (BMI) 22.0-22.9, adult: Secondary | ICD-10-CM | POA: Diagnosis not present

## 2015-01-29 DIAGNOSIS — R161 Splenomegaly, not elsewhere classified: Secondary | ICD-10-CM | POA: Diagnosis not present

## 2015-01-29 DIAGNOSIS — R109 Unspecified abdominal pain: Secondary | ICD-10-CM | POA: Diagnosis not present

## 2015-01-29 DIAGNOSIS — K769 Liver disease, unspecified: Secondary | ICD-10-CM | POA: Diagnosis not present

## 2015-01-29 DIAGNOSIS — R1031 Right lower quadrant pain: Secondary | ICD-10-CM

## 2015-01-29 DIAGNOSIS — K5732 Diverticulitis of large intestine without perforation or abscess without bleeding: Secondary | ICD-10-CM | POA: Diagnosis not present

## 2015-01-29 MED ORDER — IOHEXOL 300 MG/ML  SOLN
100.0000 mL | Freq: Once | INTRAMUSCULAR | Status: AC | PRN
  Administered 2015-01-29: 100 mL via INTRAVENOUS

## 2015-02-18 DIAGNOSIS — Z23 Encounter for immunization: Secondary | ICD-10-CM | POA: Diagnosis not present

## 2015-02-18 DIAGNOSIS — M859 Disorder of bone density and structure, unspecified: Secondary | ICD-10-CM | POA: Diagnosis not present

## 2015-02-18 DIAGNOSIS — E559 Vitamin D deficiency, unspecified: Secondary | ICD-10-CM | POA: Diagnosis not present

## 2015-04-07 DIAGNOSIS — M25532 Pain in left wrist: Secondary | ICD-10-CM | POA: Diagnosis not present

## 2015-04-07 DIAGNOSIS — R2232 Localized swelling, mass and lump, left upper limb: Secondary | ICD-10-CM | POA: Diagnosis not present

## 2015-04-17 DIAGNOSIS — K529 Noninfective gastroenteritis and colitis, unspecified: Secondary | ICD-10-CM | POA: Diagnosis not present

## 2015-04-17 DIAGNOSIS — R109 Unspecified abdominal pain: Secondary | ICD-10-CM | POA: Diagnosis not present

## 2015-04-17 DIAGNOSIS — N390 Urinary tract infection, site not specified: Secondary | ICD-10-CM | POA: Diagnosis not present

## 2015-04-28 DIAGNOSIS — M545 Low back pain: Secondary | ICD-10-CM | POA: Diagnosis not present

## 2015-05-14 DIAGNOSIS — M5489 Other dorsalgia: Secondary | ICD-10-CM | POA: Diagnosis not present

## 2015-05-14 DIAGNOSIS — M545 Low back pain: Secondary | ICD-10-CM | POA: Diagnosis not present

## 2015-05-20 DIAGNOSIS — M545 Low back pain: Secondary | ICD-10-CM | POA: Diagnosis not present

## 2015-05-20 DIAGNOSIS — M4316 Spondylolisthesis, lumbar region: Secondary | ICD-10-CM | POA: Diagnosis not present

## 2015-06-02 DIAGNOSIS — M5489 Other dorsalgia: Secondary | ICD-10-CM | POA: Diagnosis not present

## 2015-06-02 DIAGNOSIS — M545 Low back pain: Secondary | ICD-10-CM | POA: Diagnosis not present

## 2015-08-18 DIAGNOSIS — L814 Other melanin hyperpigmentation: Secondary | ICD-10-CM | POA: Diagnosis not present

## 2015-08-18 DIAGNOSIS — D225 Melanocytic nevi of trunk: Secondary | ICD-10-CM | POA: Diagnosis not present

## 2015-08-18 DIAGNOSIS — L821 Other seborrheic keratosis: Secondary | ICD-10-CM | POA: Diagnosis not present

## 2015-08-18 DIAGNOSIS — D224 Melanocytic nevi of scalp and neck: Secondary | ICD-10-CM | POA: Diagnosis not present

## 2015-08-19 ENCOUNTER — Other Ambulatory Visit: Payer: Self-pay

## 2015-08-19 DIAGNOSIS — Z1231 Encounter for screening mammogram for malignant neoplasm of breast: Secondary | ICD-10-CM

## 2015-08-20 ENCOUNTER — Ambulatory Visit
Admission: RE | Admit: 2015-08-20 | Discharge: 2015-08-20 | Disposition: A | Discharge status: Home / Self Care | Payer: Medicare Other | Source: Ambulatory Visit

## 2015-08-20 DIAGNOSIS — Z1231 Encounter for screening mammogram for malignant neoplasm of breast: Secondary | ICD-10-CM

## 2015-10-22 DIAGNOSIS — J0191 Acute recurrent sinusitis, unspecified: Secondary | ICD-10-CM | POA: Diagnosis not present

## 2015-10-22 DIAGNOSIS — Z6823 Body mass index (BMI) 23.0-23.9, adult: Secondary | ICD-10-CM | POA: Diagnosis not present

## 2016-02-13 DIAGNOSIS — N390 Urinary tract infection, site not specified: Secondary | ICD-10-CM | POA: Diagnosis not present

## 2016-02-13 DIAGNOSIS — E559 Vitamin D deficiency, unspecified: Secondary | ICD-10-CM | POA: Diagnosis not present

## 2016-02-13 DIAGNOSIS — Z0000 Encounter for general adult medical examination without abnormal findings: Secondary | ICD-10-CM | POA: Diagnosis not present

## 2016-02-13 DIAGNOSIS — R8290 Unspecified abnormal findings in urine: Secondary | ICD-10-CM | POA: Diagnosis not present

## 2016-02-18 DIAGNOSIS — Z1212 Encounter for screening for malignant neoplasm of rectum: Secondary | ICD-10-CM | POA: Diagnosis not present

## 2016-02-20 DIAGNOSIS — Z1389 Encounter for screening for other disorder: Secondary | ICD-10-CM | POA: Diagnosis not present

## 2016-02-20 DIAGNOSIS — Z6823 Body mass index (BMI) 23.0-23.9, adult: Secondary | ICD-10-CM | POA: Diagnosis not present

## 2016-02-20 DIAGNOSIS — Z0000 Encounter for general adult medical examination without abnormal findings: Secondary | ICD-10-CM | POA: Diagnosis not present

## 2016-02-20 DIAGNOSIS — M545 Low back pain: Secondary | ICD-10-CM | POA: Diagnosis not present

## 2016-02-20 DIAGNOSIS — Z780 Asymptomatic menopausal state: Secondary | ICD-10-CM | POA: Diagnosis not present

## 2016-02-20 DIAGNOSIS — J984 Other disorders of lung: Secondary | ICD-10-CM | POA: Diagnosis not present

## 2016-02-20 DIAGNOSIS — M810 Age-related osteoporosis without current pathological fracture: Secondary | ICD-10-CM | POA: Diagnosis not present

## 2016-02-20 DIAGNOSIS — E559 Vitamin D deficiency, unspecified: Secondary | ICD-10-CM | POA: Diagnosis not present

## 2016-02-20 DIAGNOSIS — R002 Palpitations: Secondary | ICD-10-CM | POA: Diagnosis not present

## 2016-02-20 DIAGNOSIS — K5730 Diverticulosis of large intestine without perforation or abscess without bleeding: Secondary | ICD-10-CM | POA: Diagnosis not present

## 2016-02-20 DIAGNOSIS — Z008 Encounter for other general examination: Secondary | ICD-10-CM | POA: Diagnosis not present

## 2016-02-20 DIAGNOSIS — F900 Attention-deficit hyperactivity disorder, predominantly inattentive type: Secondary | ICD-10-CM | POA: Diagnosis not present

## 2016-02-23 ENCOUNTER — Other Ambulatory Visit: Payer: Self-pay | Admitting: Internal Medicine

## 2016-02-23 DIAGNOSIS — R911 Solitary pulmonary nodule: Secondary | ICD-10-CM

## 2016-03-01 ENCOUNTER — Other Ambulatory Visit: Payer: Medicare Other

## 2016-03-03 ENCOUNTER — Ambulatory Visit
Admission: RE | Admit: 2016-03-03 | Discharge: 2016-03-03 | Disposition: A | Discharge status: Home / Self Care | Payer: PPO | Source: Ambulatory Visit | Attending: Internal Medicine | Admitting: Internal Medicine

## 2016-03-03 DIAGNOSIS — R911 Solitary pulmonary nodule: Secondary | ICD-10-CM

## 2016-03-03 MED ORDER — IOPAMIDOL (ISOVUE-300) INJECTION 61%
75.0000 mL | Freq: Once | INTRAVENOUS | Status: AC | PRN
  Administered 2016-03-03: 75 mL via INTRAVENOUS

## 2016-04-05 DIAGNOSIS — L570 Actinic keratosis: Secondary | ICD-10-CM | POA: Diagnosis not present

## 2016-04-05 DIAGNOSIS — L438 Other lichen planus: Secondary | ICD-10-CM | POA: Diagnosis not present

## 2016-05-07 ENCOUNTER — Ambulatory Visit: Payer: PPO | Admitting: Podiatry

## 2016-06-11 ENCOUNTER — Ambulatory Visit (INDEPENDENT_AMBULATORY_CARE_PROVIDER_SITE_OTHER): Payer: PPO

## 2016-06-11 ENCOUNTER — Ambulatory Visit (INDEPENDENT_AMBULATORY_CARE_PROVIDER_SITE_OTHER): Payer: PPO | Admitting: Podiatry

## 2016-06-11 ENCOUNTER — Encounter: Payer: Self-pay | Admitting: Podiatry

## 2016-06-11 VITALS — BP 121/69 | HR 79 | Resp 16 | Ht 63.0 in | Wt 129.0 lb

## 2016-06-11 DIAGNOSIS — M79672 Pain in left foot: Secondary | ICD-10-CM

## 2016-06-11 DIAGNOSIS — M722 Plantar fascial fibromatosis: Secondary | ICD-10-CM | POA: Diagnosis not present

## 2016-06-11 MED ORDER — TRIAMCINOLONE ACETONIDE 10 MG/ML IJ SUSP
10.0000 mg | Freq: Once | INTRAMUSCULAR | Status: AC
  Administered 2016-06-11: 10 mg

## 2016-06-11 NOTE — Progress Notes (Addendum)
Subjective:     Patient ID: Kerri Osborne, female   DOB: 1946-08-19, 70 y.o.   MRN: [id number]  HPI patient states I developed a lot of pain in my left heel that's really sore when pressed and making it hard for me to walk or wear shoe gear comfortably. States that it's been getting gradually worse   Review of Systems  All other systems reviewed and are negative.      Objective:   Physical Exam  Constitutional: She is oriented to person, place, and time.  Cardiovascular: Intact distal pulses.   Musculoskeletal: Normal range of motion.  Neurological: She is oriented to person, place, and time.  Skin: Skin is warm.  Nursing note and vitals reviewed.  neurovascular status intact muscle strength adequate range of motion within normal limits with patient found to have exquisite discomfort plantar aspect left heel insertional point tendon the calcaneus with moderate depression of the arch noted also. Found have good digital perfusion well oriented 3     Assessment:     Acute plantar fasciitis left with inflammation fluid around the medial band    Plan:     H&P x-rays reviewed and injected the fascia 3 mg Kenalog 5 mill grams Xylocaine and applied fascial brace. Also scanned for orthotics to reduce plantar pressure on the feet  X-rays indicate spur formation left heel with no signs stress fracture arthritis

## 2016-06-11 NOTE — Patient Instructions (Signed)

## 2016-06-11 NOTE — Progress Notes (Signed)
   Subjective:    Patient ID: Kerri Osborne, female    DOB: 1945/12/30, 70 y.o.   MRN: [id number]  HPI Chief Complaint  Patient presents with  . Foot Pain    Left foot; heel; x1.5 months      Review of Systems  All other systems reviewed and are negative.      Objective:   Physical Exam        Assessment & Plan:

## 2016-07-05 ENCOUNTER — Ambulatory Visit (INDEPENDENT_AMBULATORY_CARE_PROVIDER_SITE_OTHER): Payer: PPO | Admitting: Podiatry

## 2016-07-05 ENCOUNTER — Encounter: Payer: Self-pay | Admitting: Podiatry

## 2016-07-05 DIAGNOSIS — M722 Plantar fascial fibromatosis: Secondary | ICD-10-CM

## 2016-07-05 NOTE — Progress Notes (Signed)
Subjective:     Patient ID: Kerri Osborne, female   DOB: 11/17/1945, 70 y.o.   MRN: [id number]  HPI continued fasciitis   Review of Systems     Objective:   Physical Exam Neurovascular status unchanged    Assessment:     Fasciitis    Plan:     Orthotics dispensed will begin wearing them inappropriate shoes

## 2016-07-05 NOTE — Patient Instructions (Signed)

## 2016-10-29 DIAGNOSIS — D485 Neoplasm of uncertain behavior of skin: Secondary | ICD-10-CM | POA: Diagnosis not present

## 2016-10-29 DIAGNOSIS — L570 Actinic keratosis: Secondary | ICD-10-CM | POA: Diagnosis not present

## 2016-10-29 DIAGNOSIS — L814 Other melanin hyperpigmentation: Secondary | ICD-10-CM | POA: Diagnosis not present

## 2016-10-29 DIAGNOSIS — D224 Melanocytic nevi of scalp and neck: Secondary | ICD-10-CM | POA: Diagnosis not present

## 2016-10-29 DIAGNOSIS — D225 Melanocytic nevi of trunk: Secondary | ICD-10-CM | POA: Diagnosis not present

## 2016-10-29 DIAGNOSIS — L821 Other seborrheic keratosis: Secondary | ICD-10-CM | POA: Diagnosis not present

## 2017-01-04 DIAGNOSIS — Z6823 Body mass index (BMI) 23.0-23.9, adult: Secondary | ICD-10-CM | POA: Diagnosis not present

## 2017-01-04 DIAGNOSIS — M436 Torticollis: Secondary | ICD-10-CM | POA: Diagnosis not present

## 2017-01-17 ENCOUNTER — Other Ambulatory Visit: Payer: Self-pay | Admitting: Internal Medicine

## 2017-01-17 DIAGNOSIS — R51 Headache: Principal | ICD-10-CM

## 2017-01-17 DIAGNOSIS — R519 Headache, unspecified: Secondary | ICD-10-CM

## 2017-01-19 ENCOUNTER — Ambulatory Visit
Admission: RE | Admit: 2017-01-19 | Discharge: 2017-01-19 | Disposition: A | Discharge status: Home / Self Care | Payer: PPO | Source: Ambulatory Visit | Attending: Internal Medicine | Admitting: Internal Medicine

## 2017-01-19 DIAGNOSIS — R51 Headache: Principal | ICD-10-CM

## 2017-01-19 DIAGNOSIS — R519 Headache, unspecified: Secondary | ICD-10-CM

## 2017-01-25 ENCOUNTER — Other Ambulatory Visit: Payer: Self-pay | Admitting: Internal Medicine

## 2017-01-25 DIAGNOSIS — Z1231 Encounter for screening mammogram for malignant neoplasm of breast: Secondary | ICD-10-CM

## 2017-02-16 ENCOUNTER — Ambulatory Visit: Payer: PPO

## 2017-02-17 ENCOUNTER — Inpatient Hospital Stay: Admission: RE | Admit: 2017-02-17 | Payer: PPO | Source: Ambulatory Visit

## 2017-03-07 DIAGNOSIS — R05 Cough: Secondary | ICD-10-CM | POA: Diagnosis not present

## 2017-03-07 DIAGNOSIS — J0100 Acute maxillary sinusitis, unspecified: Secondary | ICD-10-CM | POA: Diagnosis not present

## 2017-03-07 DIAGNOSIS — J029 Acute pharyngitis, unspecified: Secondary | ICD-10-CM | POA: Diagnosis not present

## 2017-03-23 DIAGNOSIS — Z0000 Encounter for general adult medical examination without abnormal findings: Secondary | ICD-10-CM | POA: Diagnosis not present

## 2017-03-23 DIAGNOSIS — N390 Urinary tract infection, site not specified: Secondary | ICD-10-CM | POA: Diagnosis not present

## 2017-03-23 DIAGNOSIS — R8299 Other abnormal findings in urine: Secondary | ICD-10-CM | POA: Diagnosis not present

## 2017-03-23 DIAGNOSIS — E559 Vitamin D deficiency, unspecified: Secondary | ICD-10-CM | POA: Diagnosis not present

## 2017-03-28 DIAGNOSIS — Z1212 Encounter for screening for malignant neoplasm of rectum: Secondary | ICD-10-CM | POA: Diagnosis not present

## 2017-03-29 DIAGNOSIS — E559 Vitamin D deficiency, unspecified: Secondary | ICD-10-CM | POA: Diagnosis not present

## 2017-03-29 DIAGNOSIS — R51 Headache: Secondary | ICD-10-CM | POA: Diagnosis not present

## 2017-03-29 DIAGNOSIS — Z0000 Encounter for general adult medical examination without abnormal findings: Secondary | ICD-10-CM | POA: Diagnosis not present

## 2017-03-29 DIAGNOSIS — R002 Palpitations: Secondary | ICD-10-CM | POA: Diagnosis not present

## 2017-03-29 DIAGNOSIS — J984 Other disorders of lung: Secondary | ICD-10-CM | POA: Diagnosis not present

## 2017-03-29 DIAGNOSIS — F900 Attention-deficit hyperactivity disorder, predominantly inattentive type: Secondary | ICD-10-CM | POA: Diagnosis not present

## 2017-03-29 DIAGNOSIS — M545 Low back pain: Secondary | ICD-10-CM | POA: Diagnosis not present

## 2017-03-29 DIAGNOSIS — Z6823 Body mass index (BMI) 23.0-23.9, adult: Secondary | ICD-10-CM | POA: Diagnosis not present

## 2017-03-29 DIAGNOSIS — Z1389 Encounter for screening for other disorder: Secondary | ICD-10-CM | POA: Diagnosis not present

## 2017-03-29 DIAGNOSIS — G4709 Other insomnia: Secondary | ICD-10-CM | POA: Diagnosis not present

## 2017-03-29 DIAGNOSIS — A6000 Herpesviral infection of urogenital system, unspecified: Secondary | ICD-10-CM | POA: Diagnosis not present

## 2017-03-29 DIAGNOSIS — K5730 Diverticulosis of large intestine without perforation or abscess without bleeding: Secondary | ICD-10-CM | POA: Diagnosis not present

## 2017-05-17 DIAGNOSIS — M810 Age-related osteoporosis without current pathological fracture: Secondary | ICD-10-CM | POA: Diagnosis not present

## 2017-05-24 ENCOUNTER — Ambulatory Visit
Admission: RE | Admit: 2017-05-24 | Discharge: 2017-05-24 | Disposition: A | Discharge status: Home / Self Care | Payer: PPO | Source: Ambulatory Visit | Attending: Internal Medicine | Admitting: Internal Medicine

## 2017-05-24 DIAGNOSIS — Z1231 Encounter for screening mammogram for malignant neoplasm of breast: Secondary | ICD-10-CM | POA: Diagnosis not present

## 2017-08-15 DIAGNOSIS — Z6824 Body mass index (BMI) 24.0-24.9, adult: Secondary | ICD-10-CM | POA: Diagnosis not present

## 2017-08-15 DIAGNOSIS — F900 Attention-deficit hyperactivity disorder, predominantly inattentive type: Secondary | ICD-10-CM | POA: Diagnosis not present

## 2017-08-15 DIAGNOSIS — J0100 Acute maxillary sinusitis, unspecified: Secondary | ICD-10-CM | POA: Diagnosis not present

## 2017-08-15 DIAGNOSIS — J029 Acute pharyngitis, unspecified: Secondary | ICD-10-CM | POA: Diagnosis not present

## 2017-11-14 DIAGNOSIS — H903 Sensorineural hearing loss, bilateral: Secondary | ICD-10-CM | POA: Diagnosis not present

## 2017-11-14 DIAGNOSIS — H8003 Otosclerosis involving oval window, nonobliterative, bilateral: Secondary | ICD-10-CM | POA: Diagnosis not present

## 2017-11-14 DIAGNOSIS — H6123 Impacted cerumen, bilateral: Secondary | ICD-10-CM | POA: Diagnosis not present

## 2017-12-20 DIAGNOSIS — D2239 Melanocytic nevi of other parts of face: Secondary | ICD-10-CM | POA: Diagnosis not present

## 2017-12-20 DIAGNOSIS — D485 Neoplasm of uncertain behavior of skin: Secondary | ICD-10-CM | POA: Diagnosis not present

## 2017-12-20 DIAGNOSIS — C44729 Squamous cell carcinoma of skin of left lower limb, including hip: Secondary | ICD-10-CM | POA: Diagnosis not present

## 2017-12-20 DIAGNOSIS — L821 Other seborrheic keratosis: Secondary | ICD-10-CM | POA: Diagnosis not present

## 2017-12-20 DIAGNOSIS — D225 Melanocytic nevi of trunk: Secondary | ICD-10-CM | POA: Diagnosis not present

## 2018-02-02 DIAGNOSIS — M7542 Impingement syndrome of left shoulder: Secondary | ICD-10-CM | POA: Diagnosis not present

## 2018-02-02 DIAGNOSIS — M25512 Pain in left shoulder: Secondary | ICD-10-CM | POA: Diagnosis not present

## 2018-03-03 DIAGNOSIS — M25512 Pain in left shoulder: Secondary | ICD-10-CM | POA: Diagnosis not present

## 2018-03-03 DIAGNOSIS — M7542 Impingement syndrome of left shoulder: Secondary | ICD-10-CM | POA: Diagnosis not present

## 2018-03-09 DIAGNOSIS — C44722 Squamous cell carcinoma of skin of right lower limb, including hip: Secondary | ICD-10-CM | POA: Diagnosis not present

## 2018-03-09 DIAGNOSIS — D485 Neoplasm of uncertain behavior of skin: Secondary | ICD-10-CM | POA: Diagnosis not present

## 2018-03-09 DIAGNOSIS — Z85828 Personal history of other malignant neoplasm of skin: Secondary | ICD-10-CM | POA: Diagnosis not present

## 2018-03-09 DIAGNOSIS — L570 Actinic keratosis: Secondary | ICD-10-CM | POA: Diagnosis not present

## 2018-03-13 DIAGNOSIS — R6882 Decreased libido: Secondary | ICD-10-CM | POA: Diagnosis not present

## 2018-03-13 DIAGNOSIS — N952 Postmenopausal atrophic vaginitis: Secondary | ICD-10-CM | POA: Diagnosis not present

## 2018-04-05 DIAGNOSIS — F900 Attention-deficit hyperactivity disorder, predominantly inattentive type: Secondary | ICD-10-CM | POA: Diagnosis not present

## 2018-04-05 DIAGNOSIS — M810 Age-related osteoporosis without current pathological fracture: Secondary | ICD-10-CM | POA: Diagnosis not present

## 2018-04-05 DIAGNOSIS — C4492 Squamous cell carcinoma of skin, unspecified: Secondary | ICD-10-CM | POA: Diagnosis not present

## 2018-04-05 DIAGNOSIS — G4709 Other insomnia: Secondary | ICD-10-CM | POA: Diagnosis not present

## 2018-04-05 DIAGNOSIS — Z6823 Body mass index (BMI) 23.0-23.9, adult: Secondary | ICD-10-CM | POA: Diagnosis not present

## 2018-04-05 DIAGNOSIS — E559 Vitamin D deficiency, unspecified: Secondary | ICD-10-CM | POA: Diagnosis not present

## 2018-05-02 ENCOUNTER — Other Ambulatory Visit: Payer: Self-pay | Admitting: Internal Medicine

## 2018-05-02 DIAGNOSIS — Z1231 Encounter for screening mammogram for malignant neoplasm of breast: Secondary | ICD-10-CM

## 2018-05-29 ENCOUNTER — Ambulatory Visit
Admission: RE | Admit: 2018-05-29 | Discharge: 2018-05-29 | Disposition: A | Discharge status: Home / Self Care | Payer: PPO | Source: Ambulatory Visit | Attending: Internal Medicine | Admitting: Internal Medicine

## 2018-05-29 DIAGNOSIS — Z85828 Personal history of other malignant neoplasm of skin: Secondary | ICD-10-CM | POA: Diagnosis not present

## 2018-05-29 DIAGNOSIS — L570 Actinic keratosis: Secondary | ICD-10-CM | POA: Diagnosis not present

## 2018-05-29 DIAGNOSIS — Z1231 Encounter for screening mammogram for malignant neoplasm of breast: Secondary | ICD-10-CM | POA: Diagnosis not present

## 2018-05-29 DIAGNOSIS — L821 Other seborrheic keratosis: Secondary | ICD-10-CM | POA: Diagnosis not present

## 2018-06-19 DIAGNOSIS — R82998 Other abnormal findings in urine: Secondary | ICD-10-CM | POA: Diagnosis not present

## 2018-06-19 DIAGNOSIS — M810 Age-related osteoporosis without current pathological fracture: Secondary | ICD-10-CM | POA: Diagnosis not present

## 2018-06-19 DIAGNOSIS — Z0000 Encounter for general adult medical examination without abnormal findings: Secondary | ICD-10-CM | POA: Diagnosis not present

## 2018-06-23 DIAGNOSIS — Z1212 Encounter for screening for malignant neoplasm of rectum: Secondary | ICD-10-CM | POA: Diagnosis not present

## 2018-06-29 DIAGNOSIS — M25512 Pain in left shoulder: Secondary | ICD-10-CM | POA: Diagnosis not present

## 2018-06-29 DIAGNOSIS — E559 Vitamin D deficiency, unspecified: Secondary | ICD-10-CM | POA: Diagnosis not present

## 2018-06-29 DIAGNOSIS — Z0000 Encounter for general adult medical examination without abnormal findings: Secondary | ICD-10-CM | POA: Diagnosis not present

## 2018-06-29 DIAGNOSIS — M810 Age-related osteoporosis without current pathological fracture: Secondary | ICD-10-CM | POA: Diagnosis not present

## 2018-06-29 DIAGNOSIS — K5730 Diverticulosis of large intestine without perforation or abscess without bleeding: Secondary | ICD-10-CM | POA: Diagnosis not present

## 2018-06-29 DIAGNOSIS — Z6823 Body mass index (BMI) 23.0-23.9, adult: Secondary | ICD-10-CM | POA: Diagnosis not present

## 2018-06-29 DIAGNOSIS — G4709 Other insomnia: Secondary | ICD-10-CM | POA: Diagnosis not present

## 2018-06-29 DIAGNOSIS — F900 Attention-deficit hyperactivity disorder, predominantly inattentive type: Secondary | ICD-10-CM | POA: Diagnosis not present

## 2018-06-29 DIAGNOSIS — A6000 Herpesviral infection of urogenital system, unspecified: Secondary | ICD-10-CM | POA: Diagnosis not present

## 2018-06-29 DIAGNOSIS — Z1389 Encounter for screening for other disorder: Secondary | ICD-10-CM | POA: Diagnosis not present

## 2018-06-29 DIAGNOSIS — C4492 Squamous cell carcinoma of skin, unspecified: Secondary | ICD-10-CM | POA: Diagnosis not present

## 2018-09-01 DIAGNOSIS — Z6823 Body mass index (BMI) 23.0-23.9, adult: Secondary | ICD-10-CM | POA: Diagnosis not present

## 2018-09-01 DIAGNOSIS — J069 Acute upper respiratory infection, unspecified: Secondary | ICD-10-CM | POA: Diagnosis not present

## 2018-09-01 DIAGNOSIS — J029 Acute pharyngitis, unspecified: Secondary | ICD-10-CM | POA: Diagnosis not present

## 2018-09-22 DIAGNOSIS — Z85828 Personal history of other malignant neoplasm of skin: Secondary | ICD-10-CM | POA: Diagnosis not present

## 2019-01-02 DIAGNOSIS — D225 Melanocytic nevi of trunk: Secondary | ICD-10-CM | POA: Diagnosis not present

## 2019-01-02 DIAGNOSIS — L570 Actinic keratosis: Secondary | ICD-10-CM | POA: Diagnosis not present

## 2019-01-02 DIAGNOSIS — Z85828 Personal history of other malignant neoplasm of skin: Secondary | ICD-10-CM | POA: Diagnosis not present

## 2019-01-02 DIAGNOSIS — L821 Other seborrheic keratosis: Secondary | ICD-10-CM | POA: Diagnosis not present

## 2019-01-02 DIAGNOSIS — L814 Other melanin hyperpigmentation: Secondary | ICD-10-CM | POA: Diagnosis not present

## 2019-01-02 DIAGNOSIS — H61001 Unspecified perichondritis of right external ear: Secondary | ICD-10-CM | POA: Diagnosis not present

## 2019-01-02 DIAGNOSIS — D2261 Melanocytic nevi of right upper limb, including shoulder: Secondary | ICD-10-CM | POA: Diagnosis not present

## 2019-01-09 DIAGNOSIS — C4492 Squamous cell carcinoma of skin, unspecified: Secondary | ICD-10-CM | POA: Diagnosis not present

## 2019-01-09 DIAGNOSIS — M25512 Pain in left shoulder: Secondary | ICD-10-CM | POA: Diagnosis not present

## 2019-01-09 DIAGNOSIS — M810 Age-related osteoporosis without current pathological fracture: Secondary | ICD-10-CM | POA: Diagnosis not present

## 2019-01-09 DIAGNOSIS — G4700 Insomnia, unspecified: Secondary | ICD-10-CM | POA: Diagnosis not present

## 2019-01-09 DIAGNOSIS — F900 Attention-deficit hyperactivity disorder, predominantly inattentive type: Secondary | ICD-10-CM | POA: Diagnosis not present

## 2019-01-30 DIAGNOSIS — Z03818 Encounter for observation for suspected exposure to other biological agents ruled out: Secondary | ICD-10-CM | POA: Diagnosis not present

## 2019-02-23 DIAGNOSIS — Z20828 Contact with and (suspected) exposure to other viral communicable diseases: Secondary | ICD-10-CM | POA: Diagnosis not present

## 2019-04-30 DIAGNOSIS — Z20828 Contact with and (suspected) exposure to other viral communicable diseases: Secondary | ICD-10-CM | POA: Diagnosis not present

## 2019-05-04 ENCOUNTER — Other Ambulatory Visit: Payer: Self-pay

## 2019-05-04 DIAGNOSIS — Z20822 Contact with and (suspected) exposure to covid-19: Secondary | ICD-10-CM

## 2019-05-05 LAB — NOVEL CORONAVIRUS, NAA: SARS-CoV-2, NAA: DETECTED — AB

## 2019-05-14 DIAGNOSIS — M810 Age-related osteoporosis without current pathological fracture: Secondary | ICD-10-CM | POA: Diagnosis not present

## 2019-05-14 DIAGNOSIS — M6588 Other synovitis and tenosynovitis, other site: Secondary | ICD-10-CM | POA: Diagnosis not present

## 2019-06-01 ENCOUNTER — Ambulatory Visit
Admission: RE | Admit: 2019-06-01 | Discharge: 2019-06-01 | Disposition: A | Discharge status: Home / Self Care | Payer: PPO | Source: Ambulatory Visit | Attending: Internal Medicine | Admitting: Internal Medicine

## 2019-06-01 ENCOUNTER — Other Ambulatory Visit: Payer: Self-pay | Admitting: Internal Medicine

## 2019-06-01 ENCOUNTER — Other Ambulatory Visit: Payer: Self-pay | Admitting: Anesthesiology

## 2019-06-01 ENCOUNTER — Other Ambulatory Visit: Payer: Self-pay

## 2019-06-01 DIAGNOSIS — Z1231 Encounter for screening mammogram for malignant neoplasm of breast: Secondary | ICD-10-CM

## 2019-06-28 DIAGNOSIS — R7989 Other specified abnormal findings of blood chemistry: Secondary | ICD-10-CM | POA: Diagnosis not present

## 2019-06-28 DIAGNOSIS — E559 Vitamin D deficiency, unspecified: Secondary | ICD-10-CM | POA: Diagnosis not present

## 2019-06-28 DIAGNOSIS — Z0000 Encounter for general adult medical examination without abnormal findings: Secondary | ICD-10-CM | POA: Diagnosis not present

## 2019-06-29 DIAGNOSIS — R82998 Other abnormal findings in urine: Secondary | ICD-10-CM | POA: Diagnosis not present

## 2019-07-03 DIAGNOSIS — Z1212 Encounter for screening for malignant neoplasm of rectum: Secondary | ICD-10-CM | POA: Diagnosis not present

## 2019-07-04 DIAGNOSIS — Z85828 Personal history of other malignant neoplasm of skin: Secondary | ICD-10-CM | POA: Diagnosis not present

## 2019-07-04 DIAGNOSIS — L570 Actinic keratosis: Secondary | ICD-10-CM | POA: Diagnosis not present

## 2019-07-04 DIAGNOSIS — L821 Other seborrheic keratosis: Secondary | ICD-10-CM | POA: Diagnosis not present

## 2019-07-05 DIAGNOSIS — M6588 Other synovitis and tenosynovitis, other site: Secondary | ICD-10-CM | POA: Diagnosis not present

## 2019-07-05 DIAGNOSIS — M810 Age-related osteoporosis without current pathological fracture: Secondary | ICD-10-CM | POA: Diagnosis not present

## 2019-07-05 DIAGNOSIS — M25512 Pain in left shoulder: Secondary | ICD-10-CM | POA: Diagnosis not present

## 2019-07-05 DIAGNOSIS — E559 Vitamin D deficiency, unspecified: Secondary | ICD-10-CM | POA: Diagnosis not present

## 2019-07-05 DIAGNOSIS — A6000 Herpesviral infection of urogenital system, unspecified: Secondary | ICD-10-CM | POA: Diagnosis not present

## 2019-07-05 DIAGNOSIS — J984 Other disorders of lung: Secondary | ICD-10-CM | POA: Diagnosis not present

## 2019-07-05 DIAGNOSIS — Z20818 Contact with and (suspected) exposure to other bacterial communicable diseases: Secondary | ICD-10-CM | POA: Diagnosis not present

## 2019-07-05 DIAGNOSIS — Z20828 Contact with and (suspected) exposure to other viral communicable diseases: Secondary | ICD-10-CM | POA: Diagnosis not present

## 2019-07-05 DIAGNOSIS — C4492 Squamous cell carcinoma of skin, unspecified: Secondary | ICD-10-CM | POA: Diagnosis not present

## 2019-07-05 DIAGNOSIS — K5730 Diverticulosis of large intestine without perforation or abscess without bleeding: Secondary | ICD-10-CM | POA: Diagnosis not present

## 2019-07-05 DIAGNOSIS — Z1331 Encounter for screening for depression: Secondary | ICD-10-CM | POA: Diagnosis not present

## 2019-07-05 DIAGNOSIS — Z0000 Encounter for general adult medical examination without abnormal findings: Secondary | ICD-10-CM | POA: Diagnosis not present

## 2019-07-05 DIAGNOSIS — R002 Palpitations: Secondary | ICD-10-CM | POA: Diagnosis not present

## 2019-07-05 DIAGNOSIS — F900 Attention-deficit hyperactivity disorder, predominantly inattentive type: Secondary | ICD-10-CM | POA: Diagnosis not present

## 2019-07-15 DIAGNOSIS — R109 Unspecified abdominal pain: Secondary | ICD-10-CM | POA: Diagnosis not present

## 2019-07-15 DIAGNOSIS — N390 Urinary tract infection, site not specified: Secondary | ICD-10-CM | POA: Diagnosis not present

## 2019-07-15 DIAGNOSIS — R8290 Unspecified abnormal findings in urine: Secondary | ICD-10-CM | POA: Diagnosis not present

## 2019-08-03 DIAGNOSIS — M810 Age-related osteoporosis without current pathological fracture: Secondary | ICD-10-CM | POA: Diagnosis not present

## 2019-09-11 DIAGNOSIS — Z20828 Contact with and (suspected) exposure to other viral communicable diseases: Secondary | ICD-10-CM | POA: Diagnosis not present

## 2019-09-11 DIAGNOSIS — J3489 Other specified disorders of nose and nasal sinuses: Secondary | ICD-10-CM | POA: Diagnosis not present

## 2019-10-03 ENCOUNTER — Ambulatory Visit: Payer: PPO | Attending: Internal Medicine

## 2019-10-03 DIAGNOSIS — Z23 Encounter for immunization: Secondary | ICD-10-CM | POA: Insufficient documentation

## 2019-10-03 NOTE — Progress Notes (Signed)
   Covid-19 Vaccination Clinic  Name:  AYDRIANA KOSTRZEWA    MRN: [id number] DOB: [birth year]  10/03/2019  Ms. Boston was observed post Covid-19 immunization for 15 minutes without incidence. She was provided with Vaccine Information Sheet and instruction to access the V-Safe system.   Ms. Berardino was instructed to call 911 with any severe reactions post vaccine: Marland Kitchen Difficulty breathing  . Swelling of your face and throat  . A fast heartbeat  . A bad rash all over your body  . Dizziness and weakness    Immunizations Administered    Name Date Dose VIS Date Route   Pfizer COVID-19 Vaccine 10/03/2019  8:50 AM 0.3 mL 08/24/2019 Intramuscular   Manufacturer: [employer], [employer]   Lot: [id number]   [place]: [id number]

## 2019-10-19 ENCOUNTER — Ambulatory Visit: Payer: PPO

## 2019-10-21 ENCOUNTER — Ambulatory Visit: Payer: PPO | Attending: Internal Medicine

## 2019-10-21 DIAGNOSIS — Z23 Encounter for immunization: Secondary | ICD-10-CM | POA: Insufficient documentation

## 2019-10-21 NOTE — Progress Notes (Signed)
   Covid-19 Vaccination Clinic  Name:  Kerri Osborne    MRN: [id number] DOB: 02/07/1946  10/21/2019  [name] was observed post Covid-19 immunization for 15 minutes without incidence. She was provided with Vaccine Information Sheet and instruction to access the V-Safe system.   [name] was instructed to call 911 with any severe reactions post vaccine: Marland Kitchen Difficulty breathing  . Swelling of your face and throat  . A fast heartbeat  . A bad rash all over your body  . Dizziness and weakness    Immunizations Administered    Name Date Dose VIS Date Route   Pfizer COVID-19 Vaccine 10/21/2019  8:09 AM 0.3 mL 08/24/2019 Intramuscular   Manufacturer: [place]   Lot: [id number]   [place]: [id number]

## 2019-12-17 DIAGNOSIS — R300 Dysuria: Secondary | ICD-10-CM | POA: Diagnosis not present

## 2019-12-17 DIAGNOSIS — N390 Urinary tract infection, site not specified: Secondary | ICD-10-CM | POA: Diagnosis not present

## 2020-01-07 DIAGNOSIS — M810 Age-related osteoporosis without current pathological fracture: Secondary | ICD-10-CM | POA: Diagnosis not present

## 2020-01-07 DIAGNOSIS — F900 Attention-deficit hyperactivity disorder, predominantly inattentive type: Secondary | ICD-10-CM | POA: Diagnosis not present

## 2020-01-07 DIAGNOSIS — Z1331 Encounter for screening for depression: Secondary | ICD-10-CM | POA: Diagnosis not present

## 2020-01-07 DIAGNOSIS — M6588 Other synovitis and tenosynovitis, other site: Secondary | ICD-10-CM | POA: Diagnosis not present

## 2020-01-07 DIAGNOSIS — B351 Tinea unguium: Secondary | ICD-10-CM | POA: Diagnosis not present

## 2020-01-07 DIAGNOSIS — Z8616 Personal history of COVID-19: Secondary | ICD-10-CM | POA: Diagnosis not present

## 2020-01-30 DIAGNOSIS — D224 Melanocytic nevi of scalp and neck: Secondary | ICD-10-CM | POA: Diagnosis not present

## 2020-01-30 DIAGNOSIS — L821 Other seborrheic keratosis: Secondary | ICD-10-CM | POA: Diagnosis not present

## 2020-01-30 DIAGNOSIS — D225 Melanocytic nevi of trunk: Secondary | ICD-10-CM | POA: Diagnosis not present

## 2020-01-30 DIAGNOSIS — D485 Neoplasm of uncertain behavior of skin: Secondary | ICD-10-CM | POA: Diagnosis not present

## 2020-01-30 DIAGNOSIS — Z85828 Personal history of other malignant neoplasm of skin: Secondary | ICD-10-CM | POA: Diagnosis not present

## 2020-01-30 DIAGNOSIS — C44622 Squamous cell carcinoma of skin of right upper limb, including shoulder: Secondary | ICD-10-CM | POA: Diagnosis not present

## 2020-01-30 DIAGNOSIS — D2261 Melanocytic nevi of right upper limb, including shoulder: Secondary | ICD-10-CM | POA: Diagnosis not present

## 2020-04-29 ENCOUNTER — Other Ambulatory Visit: Payer: Self-pay | Admitting: Internal Medicine

## 2020-04-29 DIAGNOSIS — Z1231 Encounter for screening mammogram for malignant neoplasm of breast: Secondary | ICD-10-CM

## 2020-06-09 ENCOUNTER — Ambulatory Visit: Payer: PPO

## 2020-06-18 ENCOUNTER — Ambulatory Visit: Payer: PPO

## 2020-06-20 DIAGNOSIS — L821 Other seborrheic keratosis: Secondary | ICD-10-CM | POA: Diagnosis not present

## 2020-06-20 DIAGNOSIS — L814 Other melanin hyperpigmentation: Secondary | ICD-10-CM | POA: Diagnosis not present

## 2020-06-20 DIAGNOSIS — L570 Actinic keratosis: Secondary | ICD-10-CM | POA: Diagnosis not present

## 2020-06-20 DIAGNOSIS — Z85828 Personal history of other malignant neoplasm of skin: Secondary | ICD-10-CM | POA: Diagnosis not present

## 2020-07-02 DIAGNOSIS — Z79899 Other long term (current) drug therapy: Secondary | ICD-10-CM | POA: Diagnosis not present

## 2020-07-02 DIAGNOSIS — F900 Attention-deficit hyperactivity disorder, predominantly inattentive type: Secondary | ICD-10-CM | POA: Diagnosis not present

## 2020-07-07 DIAGNOSIS — Z780 Asymptomatic menopausal state: Secondary | ICD-10-CM | POA: Diagnosis not present

## 2020-07-07 DIAGNOSIS — M810 Age-related osteoporosis without current pathological fracture: Secondary | ICD-10-CM | POA: Diagnosis not present

## 2020-07-07 DIAGNOSIS — Z23 Encounter for immunization: Secondary | ICD-10-CM | POA: Diagnosis not present

## 2020-07-07 DIAGNOSIS — R002 Palpitations: Secondary | ICD-10-CM | POA: Diagnosis not present

## 2020-07-07 DIAGNOSIS — F900 Attention-deficit hyperactivity disorder, predominantly inattentive type: Secondary | ICD-10-CM | POA: Diagnosis not present

## 2020-07-07 DIAGNOSIS — G4700 Insomnia, unspecified: Secondary | ICD-10-CM | POA: Diagnosis not present

## 2020-07-07 DIAGNOSIS — Z8616 Personal history of COVID-19: Secondary | ICD-10-CM | POA: Diagnosis not present

## 2020-07-07 DIAGNOSIS — R82998 Other abnormal findings in urine: Secondary | ICD-10-CM | POA: Diagnosis not present

## 2020-07-07 DIAGNOSIS — E559 Vitamin D deficiency, unspecified: Secondary | ICD-10-CM | POA: Diagnosis not present

## 2020-07-07 DIAGNOSIS — M6588 Other synovitis and tenosynovitis, other site: Secondary | ICD-10-CM | POA: Diagnosis not present

## 2020-07-07 DIAGNOSIS — C4492 Squamous cell carcinoma of skin, unspecified: Secondary | ICD-10-CM | POA: Diagnosis not present

## 2020-07-07 DIAGNOSIS — Z0000 Encounter for general adult medical examination without abnormal findings: Secondary | ICD-10-CM | POA: Diagnosis not present

## 2020-07-22 ENCOUNTER — Ambulatory Visit: Payer: PPO

## 2020-07-24 ENCOUNTER — Ambulatory Visit
Admission: RE | Admit: 2020-07-24 | Discharge: 2020-07-24 | Disposition: A | Discharge status: Home / Self Care | Payer: PPO | Source: Ambulatory Visit | Attending: Internal Medicine | Admitting: Internal Medicine

## 2020-07-24 ENCOUNTER — Other Ambulatory Visit: Payer: Self-pay

## 2020-07-24 DIAGNOSIS — Z1231 Encounter for screening mammogram for malignant neoplasm of breast: Secondary | ICD-10-CM | POA: Diagnosis not present

## 2020-08-06 DIAGNOSIS — Z1212 Encounter for screening for malignant neoplasm of rectum: Secondary | ICD-10-CM | POA: Diagnosis not present

## 2020-11-04 DIAGNOSIS — L570 Actinic keratosis: Secondary | ICD-10-CM | POA: Diagnosis not present

## 2020-11-04 DIAGNOSIS — H61001 Unspecified perichondritis of right external ear: Secondary | ICD-10-CM | POA: Diagnosis not present

## 2020-11-04 DIAGNOSIS — Z85828 Personal history of other malignant neoplasm of skin: Secondary | ICD-10-CM | POA: Diagnosis not present

## 2020-12-24 ENCOUNTER — Other Ambulatory Visit: Payer: Self-pay

## 2020-12-24 ENCOUNTER — Ambulatory Visit: Payer: PPO | Admitting: Podiatry

## 2020-12-24 ENCOUNTER — Encounter: Payer: Self-pay | Admitting: Podiatry

## 2020-12-24 DIAGNOSIS — M21619 Bunion of unspecified foot: Secondary | ICD-10-CM | POA: Diagnosis not present

## 2020-12-24 MED ORDER — TERBINAFINE HCL 250 MG PO TABS: ORAL_TABLET | ORAL | 0 refills | Status: DC

## 2020-12-24 NOTE — Progress Notes (Signed)
Subjective:   Patient ID: Kerri Osborne, female   DOB: 75 y.o.   MRN: [id number]   HPI Patient presents stating she lost her left hallux nail is regrowing it was very yellow and she is concerned about fungus.  Would like to try to be aggressive to avoid this becoming a problem   Review of Systems  All other systems reviewed and are negative.       Objective:  Physical Exam Vitals and nursing note reviewed.  Constitutional:      Appearance: She is well-developed.  Pulmonary:     Effort: Pulmonary effort is normal.  Musculoskeletal:        General: Normal range of motion.  Skin:    General: Skin is warm.  Neurological:     Mental Status: She is alert.     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  Reappoint for approximately 2-3 laser treatments and hopefully this will grow back normally but will 4 to 6 months

## 2021-01-07 ENCOUNTER — Ambulatory Visit (INDEPENDENT_AMBULATORY_CARE_PROVIDER_SITE_OTHER): Payer: PPO

## 2021-01-07 ENCOUNTER — Other Ambulatory Visit: Payer: Self-pay

## 2021-01-07 DIAGNOSIS — B351 Tinea unguium: Secondary | ICD-10-CM

## 2021-01-07 NOTE — Patient Instructions (Signed)

## 2021-01-07 NOTE — Progress Notes (Signed)
Patient presents today for the 1st laser treatment. 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    Toenail most affected left hallux.  All other systems are negative.  Nails were filed thin. Laser therapy was administered to left hallux toenails  and patient tolerated the treatment well. All safety precautions were in place.    Follow up in 6 weeks for laser # 2.

## 2021-02-03 DIAGNOSIS — L570 Actinic keratosis: Secondary | ICD-10-CM | POA: Diagnosis not present

## 2021-02-03 DIAGNOSIS — Z85828 Personal history of other malignant neoplasm of skin: Secondary | ICD-10-CM | POA: Diagnosis not present

## 2021-02-03 DIAGNOSIS — D692 Other nonthrombocytopenic purpura: Secondary | ICD-10-CM | POA: Diagnosis not present

## 2021-02-03 DIAGNOSIS — D225 Melanocytic nevi of trunk: Secondary | ICD-10-CM | POA: Diagnosis not present

## 2021-02-03 DIAGNOSIS — L918 Other hypertrophic disorders of the skin: Secondary | ICD-10-CM | POA: Diagnosis not present

## 2021-02-03 DIAGNOSIS — L821 Other seborrheic keratosis: Secondary | ICD-10-CM | POA: Diagnosis not present

## 2021-02-03 DIAGNOSIS — D224 Melanocytic nevi of scalp and neck: Secondary | ICD-10-CM | POA: Diagnosis not present

## 2021-02-03 DIAGNOSIS — L738 Other specified follicular disorders: Secondary | ICD-10-CM | POA: Diagnosis not present

## 2021-02-20 ENCOUNTER — Ambulatory Visit (INDEPENDENT_AMBULATORY_CARE_PROVIDER_SITE_OTHER): Payer: PPO

## 2021-02-20 ENCOUNTER — Other Ambulatory Visit: Payer: Self-pay

## 2021-02-20 DIAGNOSIS — B351 Tinea unguium: Secondary | ICD-10-CM

## 2021-02-20 NOTE — Progress Notes (Signed)
Patient presents today for the 2nd laser treatment. 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    Toenail most affected left hallux.  All other systems are negative.  Nails were filed thin. Laser therapy was administered to left hallux toenails  and patient tolerated the treatment well. All safety precautions were in place.    Follow up in 6 weeks for laser # 3.

## 2021-04-03 ENCOUNTER — Other Ambulatory Visit: Payer: PPO

## 2021-04-20 ENCOUNTER — Ambulatory Visit (INDEPENDENT_AMBULATORY_CARE_PROVIDER_SITE_OTHER): Payer: Self-pay | Admitting: *Deleted

## 2021-04-20 ENCOUNTER — Other Ambulatory Visit: Payer: Self-pay

## 2021-04-20 DIAGNOSIS — B351 Tinea unguium: Secondary | ICD-10-CM

## 2021-04-20 NOTE — Progress Notes (Signed)
Patient presents today for the 3rd laser treatment. 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    Toenail most affected left hallux. Her nail has improved significantly and she is happy with the progress.  All other systems are negative.  Nails were filed thin. Laser therapy was administered to left hallux toenails  and patient tolerated the treatment well. All safety precautions were in place.   She has also completed oral terbinafine as prescribed.   Patient has completed the recommended laser treatments. She will follow up with Dr. Paulla Dolly in 3 months to evaluate progress if necessary.

## 2021-06-05 IMAGING — MG DIGITAL SCREENING BILAT W/ TOMO W/ CAD
8 series · 9 of 24 positions shown · non-contrast
Comparison: Previous exam(s).

CLINICAL DATA: Screening.

EXAM:
DIGITAL SCREENING BILATERAL MAMMOGRAM WITH TOMO AND CAD

[L CC synth-2D]
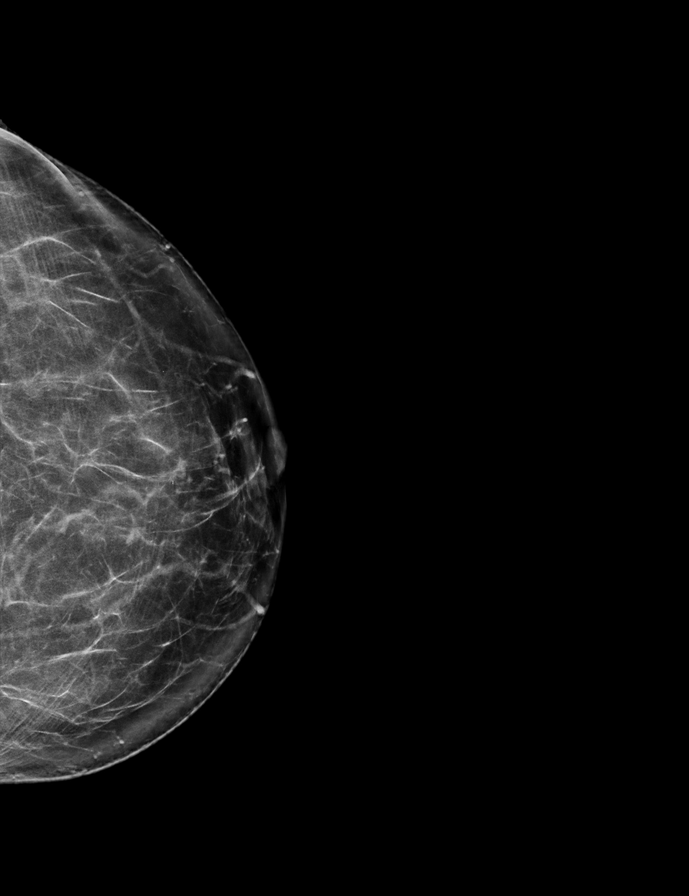

[R MLO synth-2D]
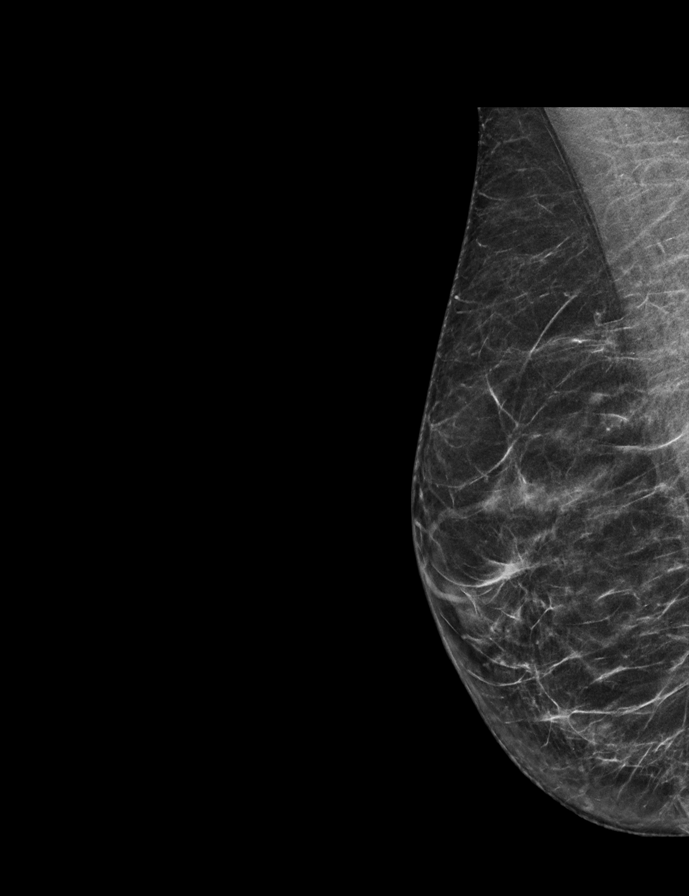

[R CC synth-2D]
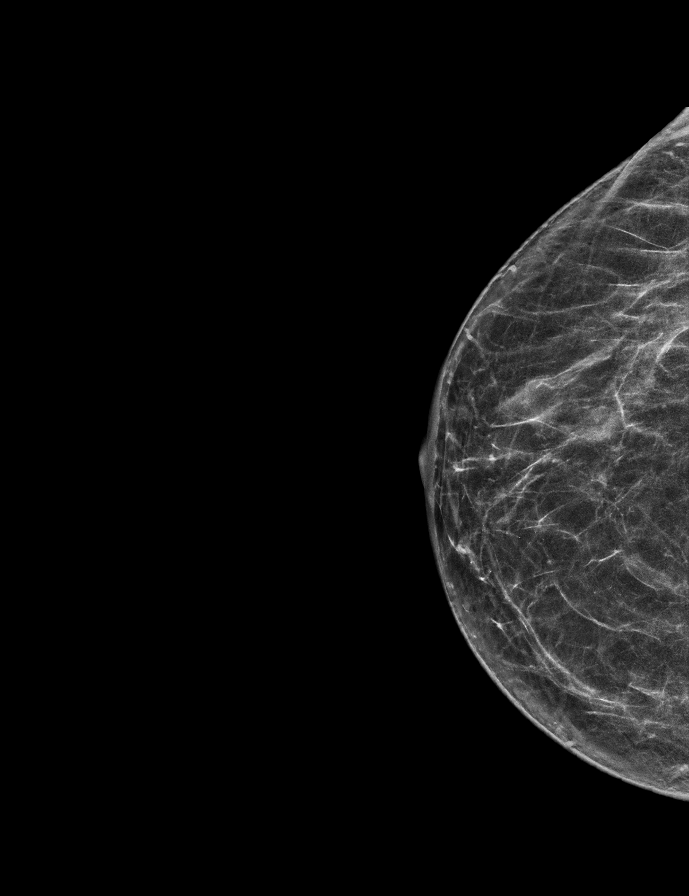

[L MLO synth-2D]
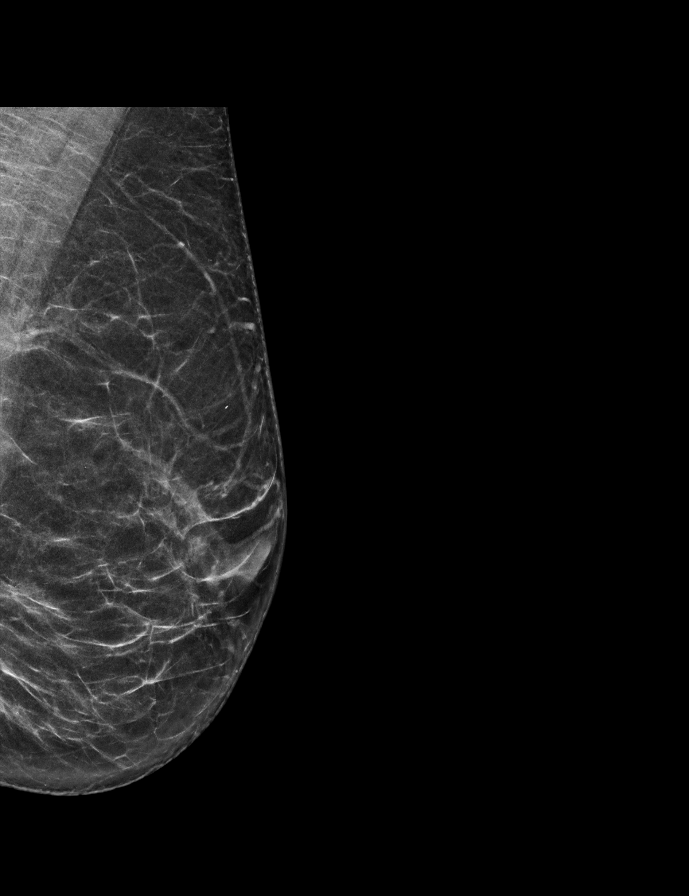

[L CC tomo · 2 of 71 frames shown]
[frame 23/71]
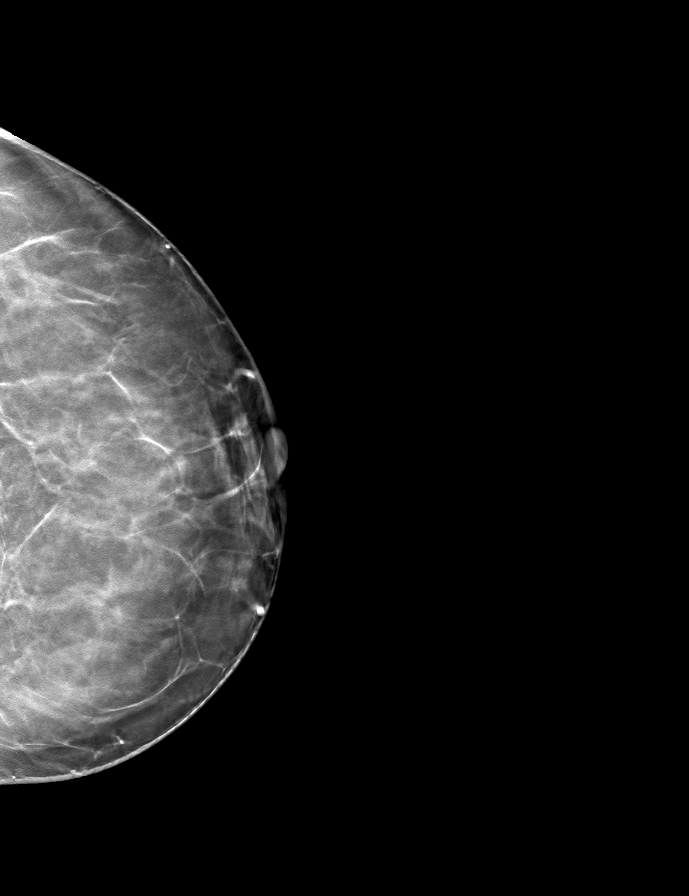
[frame 36/71]
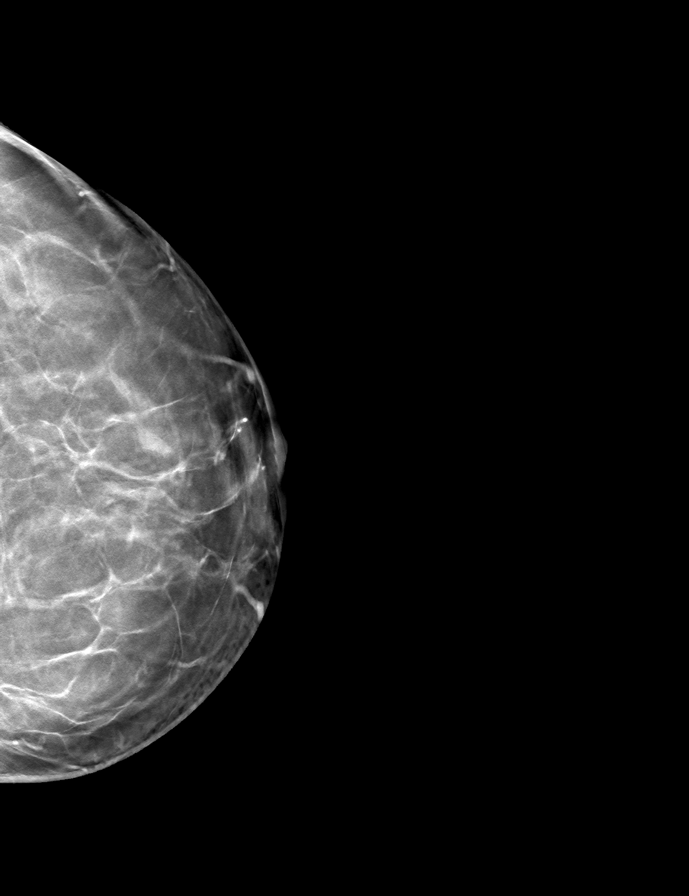

[R CC tomo · tomo slice 27/53.0]
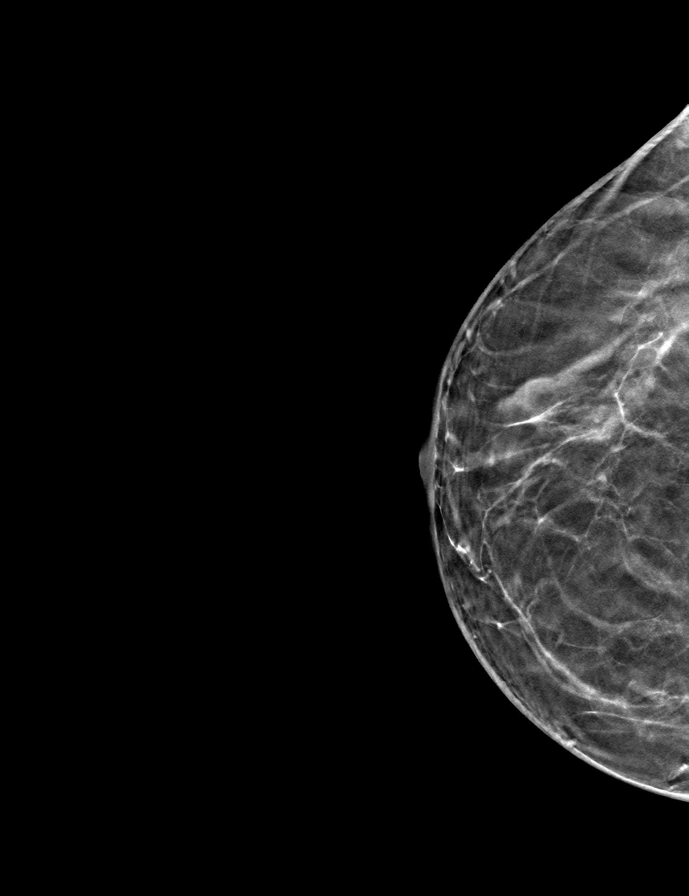

[L MLO tomo · tomo slice 29/57.0]
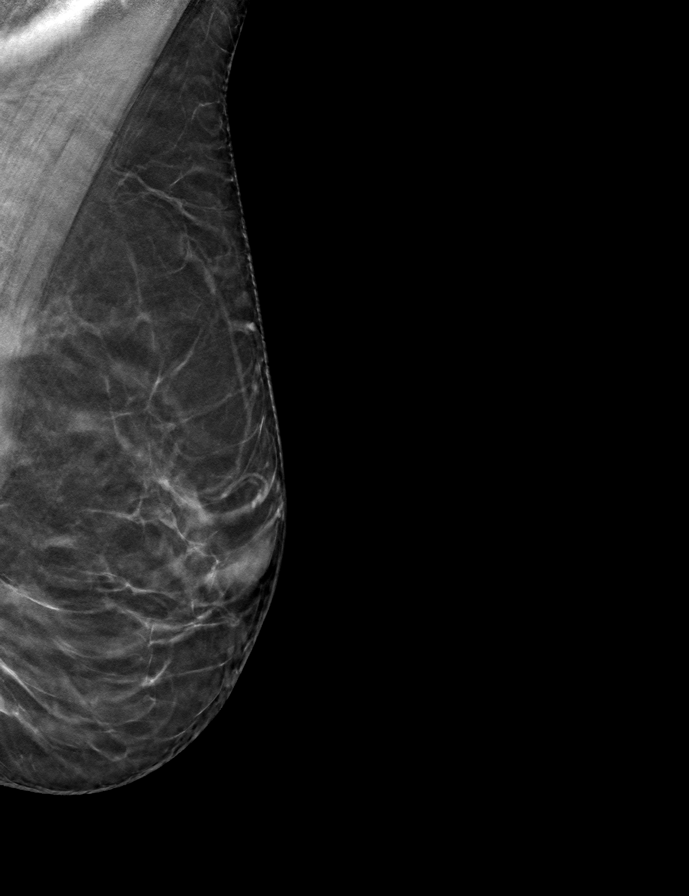

[R MLO tomo · tomo slice 27/53.0]
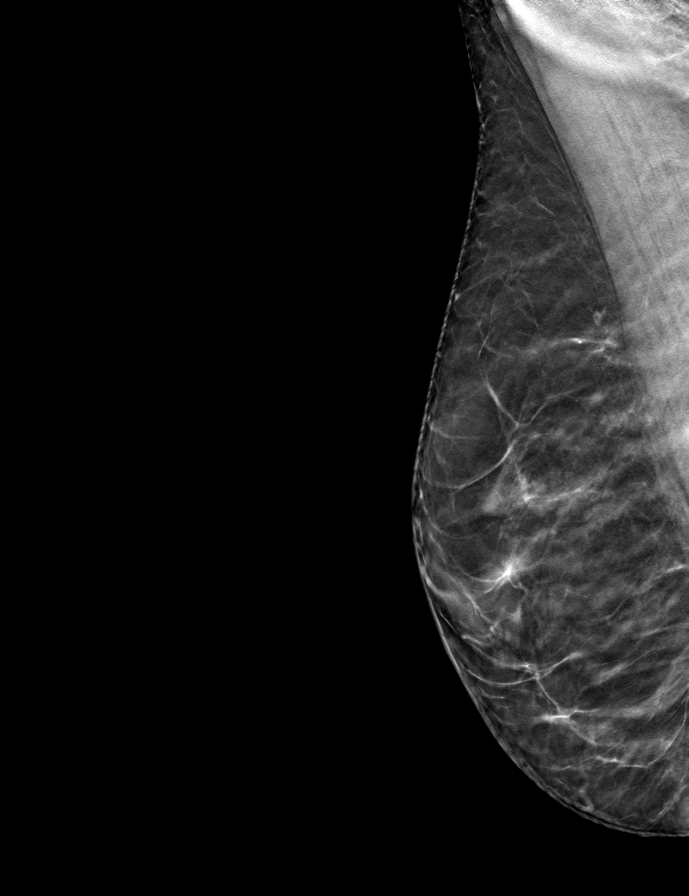

[9 of 24 positions shown; findings below may reference images not displayed]

ACR Breast Density Category b: There are scattered areas of
fibroglandular density.
FINDINGS: There are no findings suspicious for malignancy. Images were
processed with CAD.
IMPRESSION: No mammographic evidence of malignancy. A result letter of this
screening mammogram will be mailed directly to the patient.

RECOMMENDATION:
Screening mammogram in one year. (Code:CN-U-775)

BI-RADS CATEGORY  1: Negative.

## 2021-07-06 ENCOUNTER — Other Ambulatory Visit: Payer: Self-pay | Admitting: Internal Medicine

## 2021-07-06 DIAGNOSIS — Z1231 Encounter for screening mammogram for malignant neoplasm of breast: Secondary | ICD-10-CM

## 2021-07-27 DIAGNOSIS — Z79899 Other long term (current) drug therapy: Secondary | ICD-10-CM | POA: Diagnosis not present

## 2021-07-27 DIAGNOSIS — R82998 Other abnormal findings in urine: Secondary | ICD-10-CM | POA: Diagnosis not present

## 2021-07-27 DIAGNOSIS — E559 Vitamin D deficiency, unspecified: Secondary | ICD-10-CM | POA: Diagnosis not present

## 2021-07-27 DIAGNOSIS — R7989 Other specified abnormal findings of blood chemistry: Secondary | ICD-10-CM | POA: Diagnosis not present

## 2021-07-27 DIAGNOSIS — Z1212 Encounter for screening for malignant neoplasm of rectum: Secondary | ICD-10-CM | POA: Diagnosis not present

## 2021-08-03 DIAGNOSIS — Z0000 Encounter for general adult medical examination without abnormal findings: Secondary | ICD-10-CM | POA: Diagnosis not present

## 2021-08-03 DIAGNOSIS — R002 Palpitations: Secondary | ICD-10-CM | POA: Diagnosis not present

## 2021-08-03 DIAGNOSIS — A6000 Herpesviral infection of urogenital system, unspecified: Secondary | ICD-10-CM | POA: Diagnosis not present

## 2021-08-03 DIAGNOSIS — E559 Vitamin D deficiency, unspecified: Secondary | ICD-10-CM | POA: Diagnosis not present

## 2021-08-03 DIAGNOSIS — M6588 Other synovitis and tenosynovitis, other site: Secondary | ICD-10-CM | POA: Diagnosis not present

## 2021-08-03 DIAGNOSIS — M810 Age-related osteoporosis without current pathological fracture: Secondary | ICD-10-CM | POA: Diagnosis not present

## 2021-08-03 DIAGNOSIS — F900 Attention-deficit hyperactivity disorder, predominantly inattentive type: Secondary | ICD-10-CM | POA: Diagnosis not present

## 2021-08-03 DIAGNOSIS — G4700 Insomnia, unspecified: Secondary | ICD-10-CM | POA: Diagnosis not present

## 2021-08-03 DIAGNOSIS — Z8616 Personal history of COVID-19: Secondary | ICD-10-CM | POA: Diagnosis not present

## 2021-08-03 DIAGNOSIS — Z780 Asymptomatic menopausal state: Secondary | ICD-10-CM | POA: Diagnosis not present

## 2021-08-10 ENCOUNTER — Other Ambulatory Visit: Payer: Self-pay

## 2021-08-10 ENCOUNTER — Ambulatory Visit
Admission: RE | Admit: 2021-08-10 | Discharge: 2021-08-10 | Disposition: A | Discharge status: Home / Self Care | Payer: PPO | Source: Ambulatory Visit | Attending: Internal Medicine | Admitting: Internal Medicine

## 2021-08-10 DIAGNOSIS — Z1231 Encounter for screening mammogram for malignant neoplasm of breast: Secondary | ICD-10-CM

## 2021-08-11 DIAGNOSIS — L814 Other melanin hyperpigmentation: Secondary | ICD-10-CM | POA: Diagnosis not present

## 2021-08-11 DIAGNOSIS — Z85828 Personal history of other malignant neoplasm of skin: Secondary | ICD-10-CM | POA: Diagnosis not present

## 2021-08-11 DIAGNOSIS — H02729 Madarosis of unspecified eye, unspecified eyelid and periocular area: Secondary | ICD-10-CM | POA: Diagnosis not present

## 2021-08-11 DIAGNOSIS — L821 Other seborrheic keratosis: Secondary | ICD-10-CM | POA: Diagnosis not present

## 2021-08-25 ENCOUNTER — Other Ambulatory Visit: Payer: Self-pay | Admitting: Internal Medicine

## 2021-08-25 DIAGNOSIS — Z0000 Encounter for general adult medical examination without abnormal findings: Secondary | ICD-10-CM

## 2021-10-02 ENCOUNTER — Other Ambulatory Visit: Payer: Self-pay

## 2021-10-02 ENCOUNTER — Ambulatory Visit
Admission: RE | Admit: 2021-10-02 | Discharge: 2021-10-02 | Disposition: A | Discharge status: Home / Self Care | Payer: No Typology Code available for payment source | Source: Ambulatory Visit | Attending: Internal Medicine | Admitting: Internal Medicine

## 2021-10-02 DIAGNOSIS — Z0000 Encounter for general adult medical examination without abnormal findings: Secondary | ICD-10-CM

## 2021-10-02 DIAGNOSIS — R911 Solitary pulmonary nodule: Secondary | ICD-10-CM | POA: Diagnosis not present

## 2021-10-19 DIAGNOSIS — Z87891 Personal history of nicotine dependence: Secondary | ICD-10-CM | POA: Diagnosis not present

## 2021-10-19 DIAGNOSIS — Z974 Presence of external hearing-aid: Secondary | ICD-10-CM | POA: Diagnosis not present

## 2021-10-19 DIAGNOSIS — Z8669 Personal history of other diseases of the nervous system and sense organs: Secondary | ICD-10-CM | POA: Diagnosis not present

## 2021-10-19 DIAGNOSIS — Z9889 Other specified postprocedural states: Secondary | ICD-10-CM | POA: Diagnosis not present

## 2021-10-19 DIAGNOSIS — H6121 Impacted cerumen, right ear: Secondary | ICD-10-CM | POA: Diagnosis not present

## 2021-10-20 DIAGNOSIS — H906 Mixed conductive and sensorineural hearing loss, bilateral: Secondary | ICD-10-CM | POA: Diagnosis not present

## 2021-12-09 ENCOUNTER — Other Ambulatory Visit: Payer: Self-pay

## 2021-12-09 ENCOUNTER — Ambulatory Visit: Payer: PPO | Admitting: Podiatry

## 2021-12-09 DIAGNOSIS — B351 Tinea unguium: Secondary | ICD-10-CM | POA: Diagnosis not present

## 2021-12-09 MED ORDER — TERBINAFINE HCL 250 MG PO TABS: ORAL_TABLET | ORAL | 0 refills | Status: DC

## 2021-12-09 NOTE — Progress Notes (Signed)
Subjective:  ? ?Patient ID: Kerri Osborne, female   DOB: 76 y.o.   MRN: [id number]  ? ?HPI ?Patient states she did well with treatment last year for fungus infection but she has developed some fungus and now the right hallux with the one we did left doing pretty well overall ? ? ?ROS ? ? ?   ?Objective:  ?Physical Exam  ?Neurovascular status intact with the right hallux lateral shot side showing discoloration and probable trauma to the bed with the left hallux showing only mild discoloration ? ?   ?Assessment:  ?Low-grade fungal infection right over left ? ?   ?Plan:  ?H&P reviewed condition and at this point I have recommended we continue with conservative care we will start her again on a pulse Lamisil pack for 7 days for 4 months and laser that she is scheduled for right over left ?   ? ? ?

## 2021-12-28 ENCOUNTER — Ambulatory Visit (INDEPENDENT_AMBULATORY_CARE_PROVIDER_SITE_OTHER): Payer: Self-pay | Admitting: *Deleted

## 2021-12-28 DIAGNOSIS — B351 Tinea unguium: Secondary | ICD-10-CM

## 2021-12-28 NOTE — Progress Notes (Signed)
Patient presents today for the 4th laser treatment. 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   ? ?Toenail most affected is the hallux right. ? ?All other systems are negative. ? ?Nails were filed thin. Laser therapy was administered to bilateral hallux toenail and patient tolerated the treatment well. All safety precautions were in place.  ? ?She has already completed one 4 month pulse round of lamisil and she just started another round on 12/09/21. ? ?Follow up in 6 weeks for laser #5 ? ? ? ? ?

## 2022-02-12 ENCOUNTER — Ambulatory Visit (INDEPENDENT_AMBULATORY_CARE_PROVIDER_SITE_OTHER): Payer: PPO | Admitting: *Deleted

## 2022-02-12 DIAGNOSIS — B351 Tinea unguium: Secondary | ICD-10-CM

## 2022-02-12 NOTE — Progress Notes (Signed)
Patient presents today for the 5th laser treatment. 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    Toenail most affected is now the hallux left. The right appears to be clearer and the left has new fungal growth. Patient is very active. We discussed nail trauma and the effect it has on fungal growth.  All other systems are negative.  Nails were filed thin. Laser therapy was administered to bilateral hallux toenail and patient tolerated the treatment well. All safety precautions were in place.   She has already completed one 4 month pulse round of lamisil and she just started another round on 12/09/21.  Follow up in 8 weeks for laser #6

## 2022-03-03 DIAGNOSIS — H2513 Age-related nuclear cataract, bilateral: Secondary | ICD-10-CM | POA: Diagnosis not present

## 2022-03-10 DIAGNOSIS — Z85828 Personal history of other malignant neoplasm of skin: Secondary | ICD-10-CM | POA: Diagnosis not present

## 2022-03-10 DIAGNOSIS — L821 Other seborrheic keratosis: Secondary | ICD-10-CM | POA: Diagnosis not present

## 2022-03-10 DIAGNOSIS — L603 Nail dystrophy: Secondary | ICD-10-CM | POA: Diagnosis not present

## 2022-03-10 DIAGNOSIS — H02729 Madarosis of unspecified eye, unspecified eyelid and periocular area: Secondary | ICD-10-CM | POA: Diagnosis not present

## 2022-03-10 DIAGNOSIS — L814 Other melanin hyperpigmentation: Secondary | ICD-10-CM | POA: Diagnosis not present

## 2022-03-10 DIAGNOSIS — D2261 Melanocytic nevi of right upper limb, including shoulder: Secondary | ICD-10-CM | POA: Diagnosis not present

## 2022-03-10 DIAGNOSIS — D225 Melanocytic nevi of trunk: Secondary | ICD-10-CM | POA: Diagnosis not present

## 2022-04-16 ENCOUNTER — Ambulatory Visit (INDEPENDENT_AMBULATORY_CARE_PROVIDER_SITE_OTHER): Payer: Self-pay | Admitting: *Deleted

## 2022-04-16 DIAGNOSIS — B351 Tinea unguium: Secondary | ICD-10-CM

## 2022-04-16 NOTE — Progress Notes (Signed)
Patient presents today for the 6th laser treatment. 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    Toenail most affected is now the hallux left. Patient is very active. We discussed nail trauma and the effect it has on fungal growth. There has been no significant improvement in the nails.  All other systems are negative.  Nails were filed thin. Laser therapy was administered to bilateral hallux toenail and patient tolerated the treatment well. All safety precautions were in place.   She has already completed one 4 month pulse round of lamisil and she just started another round on 12/09/21.  Follow up in 8 weeks for laser #6

## 2022-04-28 DIAGNOSIS — H2513 Age-related nuclear cataract, bilateral: Secondary | ICD-10-CM | POA: Diagnosis not present

## 2022-04-28 DIAGNOSIS — H52203 Unspecified astigmatism, bilateral: Secondary | ICD-10-CM | POA: Diagnosis not present

## 2022-05-18 DIAGNOSIS — L905 Scar conditions and fibrosis of skin: Secondary | ICD-10-CM | POA: Diagnosis not present

## 2022-05-18 DIAGNOSIS — Z85828 Personal history of other malignant neoplasm of skin: Secondary | ICD-10-CM | POA: Diagnosis not present

## 2022-05-18 DIAGNOSIS — L603 Nail dystrophy: Secondary | ICD-10-CM | POA: Diagnosis not present

## 2022-06-23 ENCOUNTER — Ambulatory Visit: Payer: PPO | Admitting: Podiatry

## 2022-07-20 DIAGNOSIS — H52201 Unspecified astigmatism, right eye: Secondary | ICD-10-CM | POA: Diagnosis not present

## 2022-07-20 DIAGNOSIS — H2511 Age-related nuclear cataract, right eye: Secondary | ICD-10-CM | POA: Diagnosis not present

## 2022-07-20 DIAGNOSIS — Z961 Presence of intraocular lens: Secondary | ICD-10-CM | POA: Diagnosis not present

## 2022-07-20 DIAGNOSIS — H269 Unspecified cataract: Secondary | ICD-10-CM | POA: Diagnosis not present

## 2022-07-29 ENCOUNTER — Other Ambulatory Visit: Payer: Self-pay | Admitting: Internal Medicine

## 2022-07-29 DIAGNOSIS — Z1231 Encounter for screening mammogram for malignant neoplasm of breast: Secondary | ICD-10-CM

## 2022-08-10 DIAGNOSIS — H2512 Age-related nuclear cataract, left eye: Secondary | ICD-10-CM | POA: Diagnosis not present

## 2022-08-10 DIAGNOSIS — H269 Unspecified cataract: Secondary | ICD-10-CM | POA: Diagnosis not present

## 2022-08-10 DIAGNOSIS — Z961 Presence of intraocular lens: Secondary | ICD-10-CM | POA: Diagnosis not present

## 2022-08-10 DIAGNOSIS — H52202 Unspecified astigmatism, left eye: Secondary | ICD-10-CM | POA: Diagnosis not present

## 2022-08-19 ENCOUNTER — Encounter: Payer: Self-pay | Admitting: Podiatry

## 2022-08-19 ENCOUNTER — Ambulatory Visit: Payer: PPO | Admitting: Podiatry

## 2022-08-19 DIAGNOSIS — B351 Tinea unguium: Secondary | ICD-10-CM | POA: Diagnosis not present

## 2022-08-19 DIAGNOSIS — R7989 Other specified abnormal findings of blood chemistry: Secondary | ICD-10-CM | POA: Diagnosis not present

## 2022-08-19 DIAGNOSIS — Z79899 Other long term (current) drug therapy: Secondary | ICD-10-CM | POA: Diagnosis not present

## 2022-08-19 DIAGNOSIS — Z1212 Encounter for screening for malignant neoplasm of rectum: Secondary | ICD-10-CM | POA: Diagnosis not present

## 2022-08-19 DIAGNOSIS — E559 Vitamin D deficiency, unspecified: Secondary | ICD-10-CM | POA: Diagnosis not present

## 2022-08-19 DIAGNOSIS — R82998 Other abnormal findings in urine: Secondary | ICD-10-CM | POA: Diagnosis not present

## 2022-08-19 NOTE — Progress Notes (Signed)
Subjective:   Patient ID: Kerri Osborne, female   DOB: 76 y.o.   MRN: [id number]   HPI Patient presents stating that she is still getting some nail fungus and the big toenails are what is involved and states that is frustrating   ROS      Objective:  Physical Exam  Neurovascular status intact with patient having discolored hallux nails bilateral that we have tried a pulse antifungal and laser in the past     Assessment:  Possibility for a chronic fungal infection versus versus traumatic nail disease     Plan:  H&P reviewed both conditions and recommended we go ahead and get a culture of this fungal and then if it turns out to be positive we could consider a more aggressive oral and laser approach.

## 2022-08-26 DIAGNOSIS — R931 Abnormal findings on diagnostic imaging of heart and coronary circulation: Secondary | ICD-10-CM | POA: Diagnosis not present

## 2022-08-26 DIAGNOSIS — E559 Vitamin D deficiency, unspecified: Secondary | ICD-10-CM | POA: Diagnosis not present

## 2022-08-26 DIAGNOSIS — Z0000 Encounter for general adult medical examination without abnormal findings: Secondary | ICD-10-CM | POA: Diagnosis not present

## 2022-08-26 DIAGNOSIS — M810 Age-related osteoporosis without current pathological fracture: Secondary | ICD-10-CM | POA: Diagnosis not present

## 2022-08-26 DIAGNOSIS — Z1331 Encounter for screening for depression: Secondary | ICD-10-CM | POA: Diagnosis not present

## 2022-08-26 DIAGNOSIS — G4700 Insomnia, unspecified: Secondary | ICD-10-CM | POA: Diagnosis not present

## 2022-08-26 DIAGNOSIS — Z1389 Encounter for screening for other disorder: Secondary | ICD-10-CM | POA: Diagnosis not present

## 2022-08-26 DIAGNOSIS — B351 Tinea unguium: Secondary | ICD-10-CM | POA: Diagnosis not present

## 2022-08-26 DIAGNOSIS — C4492 Squamous cell carcinoma of skin, unspecified: Secondary | ICD-10-CM | POA: Diagnosis not present

## 2022-08-26 DIAGNOSIS — F900 Attention-deficit hyperactivity disorder, predominantly inattentive type: Secondary | ICD-10-CM | POA: Diagnosis not present

## 2022-08-26 DIAGNOSIS — H9190 Unspecified hearing loss, unspecified ear: Secondary | ICD-10-CM | POA: Diagnosis not present

## 2022-09-01 ENCOUNTER — Other Ambulatory Visit: Payer: Self-pay

## 2022-09-01 DIAGNOSIS — B351 Tinea unguium: Secondary | ICD-10-CM

## 2022-09-14 ENCOUNTER — Telehealth: Payer: Self-pay | Admitting: *Deleted

## 2022-09-14 NOTE — Telephone Encounter (Signed)
Patient is calling because her great toenail is loose and almost coming off, has bandaged up,upcoming appointment on 09/16/22?,can we get  her in for a sooner day?

## 2022-09-16 ENCOUNTER — Ambulatory Visit: Payer: PPO | Admitting: Podiatry

## 2022-09-16 VITALS — BP 124/57

## 2022-09-16 DIAGNOSIS — B351 Tinea unguium: Secondary | ICD-10-CM

## 2022-09-16 DIAGNOSIS — L600 Ingrowing nail: Secondary | ICD-10-CM

## 2022-09-16 MED ORDER — TERBINAFINE HCL 250 MG PO TABS: 250.0000 mg | ORAL_TABLET | Freq: Every day | ORAL | 0 refills | Status: DC

## 2022-09-16 NOTE — Patient Instructions (Signed)

## 2022-09-16 NOTE — Progress Notes (Signed)
Subjective:   Patient ID: Kerri Osborne, female   DOB: 77 y.o.   MRN: [id number]   HPI Patient presents with chronic discoloration hallux nails both feet and the left hallux nail is partially detached especially on the lateral side with some attachment on the medial side   ROS      Objective:  Physical Exam  Neurovascular status intact with no other pathology except for nail disease with yellow discoloration and also trauma and detachment of the hallux nail with ingrown toenail deformity     Assessment:  Probability for mycotic nail infection along with traumatized nail ingrown toenail also present left     Plan:  Sterile removal of the left hallux nail which was hanging and I removed it from the medial side and did well with no drainage and discussed the fungal condition and at this point she wants to try aggressive approach and I have recommended oral antifungal consisting of Lamisil 250 mg daily for 90 days and I did receive blood work indicating normal liver function and I evaluated it today.  Patient will be seen back to recheck as needed

## 2022-09-23 ENCOUNTER — Ambulatory Visit: Payer: PPO | Admitting: Podiatry

## 2022-09-29 ENCOUNTER — Ambulatory Visit
Admission: RE | Admit: 2022-09-29 | Discharge: 2022-09-29 | Disposition: A | Discharge status: Home / Self Care | Payer: PPO | Source: Ambulatory Visit | Attending: Internal Medicine | Admitting: Internal Medicine

## 2022-09-29 DIAGNOSIS — Z1231 Encounter for screening mammogram for malignant neoplasm of breast: Secondary | ICD-10-CM

## 2022-11-24 DIAGNOSIS — H906 Mixed conductive and sensorineural hearing loss, bilateral: Secondary | ICD-10-CM | POA: Diagnosis not present

## 2023-01-07 DIAGNOSIS — M79674 Pain in right toe(s): Secondary | ICD-10-CM | POA: Diagnosis not present

## 2023-01-17 DIAGNOSIS — Z85828 Personal history of other malignant neoplasm of skin: Secondary | ICD-10-CM | POA: Diagnosis not present

## 2023-01-17 DIAGNOSIS — C44729 Squamous cell carcinoma of skin of left lower limb, including hip: Secondary | ICD-10-CM | POA: Diagnosis not present

## 2023-03-11 DIAGNOSIS — B349 Viral infection, unspecified: Secondary | ICD-10-CM | POA: Diagnosis not present

## 2023-03-11 DIAGNOSIS — R0981 Nasal congestion: Secondary | ICD-10-CM | POA: Diagnosis not present

## 2023-03-11 DIAGNOSIS — J029 Acute pharyngitis, unspecified: Secondary | ICD-10-CM | POA: Diagnosis not present

## 2023-06-01 DIAGNOSIS — L738 Other specified follicular disorders: Secondary | ICD-10-CM | POA: Diagnosis not present

## 2023-06-01 DIAGNOSIS — L814 Other melanin hyperpigmentation: Secondary | ICD-10-CM | POA: Diagnosis not present

## 2023-06-01 DIAGNOSIS — L570 Actinic keratosis: Secondary | ICD-10-CM | POA: Diagnosis not present

## 2023-06-01 DIAGNOSIS — Z85828 Personal history of other malignant neoplasm of skin: Secondary | ICD-10-CM | POA: Diagnosis not present

## 2023-06-01 DIAGNOSIS — L821 Other seborrheic keratosis: Secondary | ICD-10-CM | POA: Diagnosis not present

## 2023-06-01 DIAGNOSIS — H61001 Unspecified perichondritis of right external ear: Secondary | ICD-10-CM | POA: Diagnosis not present

## 2023-06-01 DIAGNOSIS — D225 Melanocytic nevi of trunk: Secondary | ICD-10-CM | POA: Diagnosis not present

## 2023-06-01 DIAGNOSIS — C44729 Squamous cell carcinoma of skin of left lower limb, including hip: Secondary | ICD-10-CM | POA: Diagnosis not present

## 2023-06-29 DIAGNOSIS — M1810 Unilateral primary osteoarthritis of first carpometacarpal joint, unspecified hand: Secondary | ICD-10-CM | POA: Diagnosis not present

## 2023-06-29 DIAGNOSIS — M79641 Pain in right hand: Secondary | ICD-10-CM | POA: Diagnosis not present

## 2023-06-29 DIAGNOSIS — M25531 Pain in right wrist: Secondary | ICD-10-CM | POA: Diagnosis not present

## 2023-08-26 ENCOUNTER — Other Ambulatory Visit: Payer: Self-pay | Admitting: Internal Medicine

## 2023-08-26 DIAGNOSIS — Z1231 Encounter for screening mammogram for malignant neoplasm of breast: Secondary | ICD-10-CM

## 2023-08-29 DIAGNOSIS — E559 Vitamin D deficiency, unspecified: Secondary | ICD-10-CM | POA: Diagnosis not present

## 2023-08-29 DIAGNOSIS — Z79899 Other long term (current) drug therapy: Secondary | ICD-10-CM | POA: Diagnosis not present

## 2023-08-29 DIAGNOSIS — R7989 Other specified abnormal findings of blood chemistry: Secondary | ICD-10-CM | POA: Diagnosis not present

## 2023-08-29 DIAGNOSIS — R002 Palpitations: Secondary | ICD-10-CM | POA: Diagnosis not present

## 2023-08-29 DIAGNOSIS — R519 Headache, unspecified: Secondary | ICD-10-CM | POA: Diagnosis not present

## 2023-08-29 DIAGNOSIS — M810 Age-related osteoporosis without current pathological fracture: Secondary | ICD-10-CM | POA: Diagnosis not present

## 2023-08-30 DIAGNOSIS — F900 Attention-deficit hyperactivity disorder, predominantly inattentive type: Secondary | ICD-10-CM | POA: Diagnosis not present

## 2023-08-30 DIAGNOSIS — E559 Vitamin D deficiency, unspecified: Secondary | ICD-10-CM | POA: Diagnosis not present

## 2023-08-30 DIAGNOSIS — R931 Abnormal findings on diagnostic imaging of heart and coronary circulation: Secondary | ICD-10-CM | POA: Diagnosis not present

## 2023-08-30 DIAGNOSIS — Z0000 Encounter for general adult medical examination without abnormal findings: Secondary | ICD-10-CM | POA: Diagnosis not present

## 2023-08-30 DIAGNOSIS — Z23 Encounter for immunization: Secondary | ICD-10-CM | POA: Diagnosis not present

## 2023-08-30 DIAGNOSIS — M25512 Pain in left shoulder: Secondary | ICD-10-CM | POA: Diagnosis not present

## 2023-08-30 DIAGNOSIS — Z1331 Encounter for screening for depression: Secondary | ICD-10-CM | POA: Diagnosis not present

## 2023-08-30 DIAGNOSIS — M7989 Other specified soft tissue disorders: Secondary | ICD-10-CM | POA: Diagnosis not present

## 2023-08-30 DIAGNOSIS — R82998 Other abnormal findings in urine: Secondary | ICD-10-CM | POA: Diagnosis not present

## 2023-08-30 DIAGNOSIS — C4492 Squamous cell carcinoma of skin, unspecified: Secondary | ICD-10-CM | POA: Diagnosis not present

## 2023-08-30 DIAGNOSIS — Z1339 Encounter for screening examination for other mental health and behavioral disorders: Secondary | ICD-10-CM | POA: Diagnosis not present

## 2023-08-30 DIAGNOSIS — M810 Age-related osteoporosis without current pathological fracture: Secondary | ICD-10-CM | POA: Diagnosis not present

## 2023-08-30 DIAGNOSIS — G4700 Insomnia, unspecified: Secondary | ICD-10-CM | POA: Diagnosis not present

## 2023-08-30 DIAGNOSIS — A6000 Herpesviral infection of urogenital system, unspecified: Secondary | ICD-10-CM | POA: Diagnosis not present

## 2023-09-01 DIAGNOSIS — Z1212 Encounter for screening for malignant neoplasm of rectum: Secondary | ICD-10-CM | POA: Diagnosis not present

## 2023-09-28 DIAGNOSIS — Z961 Presence of intraocular lens: Secondary | ICD-10-CM | POA: Diagnosis not present

## 2023-10-05 DIAGNOSIS — D485 Neoplasm of uncertain behavior of skin: Secondary | ICD-10-CM | POA: Diagnosis not present

## 2023-10-05 DIAGNOSIS — C44729 Squamous cell carcinoma of skin of left lower limb, including hip: Secondary | ICD-10-CM | POA: Diagnosis not present

## 2023-10-05 DIAGNOSIS — Z85828 Personal history of other malignant neoplasm of skin: Secondary | ICD-10-CM | POA: Diagnosis not present

## 2023-10-06 ENCOUNTER — Ambulatory Visit
Admission: RE | Admit: 2023-10-06 | Discharge: 2023-10-06 | Disposition: A | Discharge status: Home / Self Care | Payer: PPO | Source: Ambulatory Visit | Attending: Internal Medicine | Admitting: Internal Medicine

## 2023-10-06 DIAGNOSIS — Z1231 Encounter for screening mammogram for malignant neoplasm of breast: Secondary | ICD-10-CM

## 2023-11-21 DIAGNOSIS — Z85828 Personal history of other malignant neoplasm of skin: Secondary | ICD-10-CM | POA: Diagnosis not present

## 2023-11-21 DIAGNOSIS — C44729 Squamous cell carcinoma of skin of left lower limb, including hip: Secondary | ICD-10-CM | POA: Diagnosis not present

## 2023-12-05 DIAGNOSIS — L0889 Other specified local infections of the skin and subcutaneous tissue: Secondary | ICD-10-CM | POA: Diagnosis not present

## 2024-01-17 ENCOUNTER — Encounter: Payer: Self-pay | Admitting: Internal Medicine

## 2024-02-13 DIAGNOSIS — L603 Nail dystrophy: Secondary | ICD-10-CM | POA: Diagnosis not present

## 2024-02-13 DIAGNOSIS — L570 Actinic keratosis: Secondary | ICD-10-CM | POA: Diagnosis not present

## 2024-02-13 DIAGNOSIS — C44722 Squamous cell carcinoma of skin of right lower limb, including hip: Secondary | ICD-10-CM | POA: Diagnosis not present

## 2024-02-13 DIAGNOSIS — Z85828 Personal history of other malignant neoplasm of skin: Secondary | ICD-10-CM | POA: Diagnosis not present

## 2024-02-13 DIAGNOSIS — L821 Other seborrheic keratosis: Secondary | ICD-10-CM | POA: Diagnosis not present

## 2024-03-13 ENCOUNTER — Ambulatory Visit (AMBULATORY_SURGERY_CENTER)

## 2024-03-13 ENCOUNTER — Encounter: Payer: Self-pay | Admitting: Internal Medicine

## 2024-03-13 VITALS — Ht 63.0 in | Wt 127.0 lb

## 2024-03-13 DIAGNOSIS — Z1211 Encounter for screening for malignant neoplasm of colon: Secondary | ICD-10-CM

## 2024-03-13 MED ORDER — NA SULFATE-K SULFATE-MG SULF 17.5-3.13-1.6 GM/177ML PO SOLN: 1.0000 | Freq: Once | ORAL | 0 refills | Status: AC

## 2024-03-13 NOTE — Progress Notes (Signed)

## 2024-04-03 ENCOUNTER — Encounter: Payer: Self-pay | Admitting: Internal Medicine

## 2024-04-03 ENCOUNTER — Ambulatory Visit (AMBULATORY_SURGERY_CENTER): Admitting: Internal Medicine

## 2024-04-03 VITALS — BP 103/60 | HR 67 | Temp 97.6°F | Resp 13 | Ht 63.0 in | Wt 127.0 lb

## 2024-04-03 DIAGNOSIS — F909 Attention-deficit hyperactivity disorder, unspecified type: Secondary | ICD-10-CM | POA: Diagnosis not present

## 2024-04-03 DIAGNOSIS — Z1211 Encounter for screening for malignant neoplasm of colon: Secondary | ICD-10-CM

## 2024-04-03 DIAGNOSIS — K5730 Diverticulosis of large intestine without perforation or abscess without bleeding: Secondary | ICD-10-CM | POA: Diagnosis not present

## 2024-04-03 MED ORDER — SODIUM CHLORIDE 0.9 % IV SOLN: 500.0000 mL | Freq: Once | INTRAVENOUS | Status: AC

## 2024-04-03 NOTE — Progress Notes (Signed)
 HISTORY OF PRESENT ILLNESS:  Kerri Osborne is a 78 y.o. female presents for screening colonoscopy.  Previous examinations all negative for neoplasia.  Most recently 2015  REVIEW OF SYSTEMS:  All non-GI ROS negative except for  Past Medical History:  Diagnosis Date   ADD (attention deficit disorder)     Past Surgical History:  Procedure Laterality Date   AUGMENTATION MAMMAPLASTY  1982, 2007   Implants and implant removal   CARPAL TUNNEL RELEASE Right 2014   CESAREAN SECTION  1983, 1985   X 2   FOREIGN BODY REMOVAL Right 03/09/2013   Procedure: FOREIGN BODY REMOVAL ADULT I and  D abcess Right great toe ;  Surgeon: Fonda SHAUNNA Olmsted, MD;  Location: [facility];  Service: Orthopedics;  Laterality: Right;   HALLUX VALGUS CORRECTION Right 2014   great toe    Social History Kerri Osborne  reports that she quit smoking about 53 years ago. Her smoking use included cigarettes. She has never used smokeless tobacco. She reports current alcohol  use of about 7.0 standard drinks of alcohol  per week. She reports that she does not use drugs.  family history includes Diabetes in her mother.  No Known Allergies     PHYSICAL EXAMINATION: Vital signs: BP 110/63   Pulse 68   Temp 97.6 F (36.4 C) (Temporal)   Ht 5' 3 (1.6 m)   Wt 127 lb (57.6 kg)   SpO2 93%   BMI 22.50 kg/m  General: Well-developed, well-nourished, no acute distress HEENT: Sclerae are anicteric, conjunctiva pink. Oral mucosa intact Lungs: Clear Heart: Regular Abdomen: soft, nontender, nondistended, no obvious ascites, no peritoneal signs, normal bowel sounds. No organomegaly. Extremities: No edema Psychiatric: alert and oriented x3. Cooperative     ASSESSMENT:  Colon cancer screening   PLAN:   Screening colonoscopy

## 2024-04-03 NOTE — Patient Instructions (Addendum)
 YOU HAD AN ENDOSCOPIC PROCEDURE TODAY AT THE [facility]:   Refer to the procedure report that was given to you for any specific questions about what was found during the examination.  If the procedure report does not answer your questions, please call your gastroenterologist to clarify.  If you requested that your care partner not be given the details of your procedure findings, then the procedure report has been included in a sealed envelope for you to review at your convenience later.  YOU SHOULD EXPECT: Some feelings of bloating in the abdomen. Passage of more gas than usual.  Walking can help get rid of the air that was put into your GI tract during the procedure and reduce the bloating. If you had a lower endoscopy (such as a colonoscopy or flexible sigmoidoscopy) you may notice spotting of blood in your stool or on the toilet paper. If you underwent a bowel prep for your procedure, you may not have a normal bowel movement for a few days.  Please Note:  You might notice some irritation and congestion in your nose or some drainage.  This is from the oxygen used during your procedure.  There is no need for concern and it should clear up in a day or so.  SYMPTOMS TO REPORT IMMEDIATELY:  Following lower endoscopy (colonoscopy or flexible sigmoidoscopy):  Excessive amounts of blood in the stool  Significant tenderness or worsening of abdominal pains  Swelling of the abdomen that is new, acute  Fever of 100F or higher   For urgent or emergent issues, a gastroenterologist can be reached at any hour by calling [phone]. Do not use MyChart messaging for urgent concerns.    DIET:  We do recommend a small meal at first, but then you may proceed to your regular diet.  Drink plenty of fluids but you should avoid alcoholic beverages for 24 hours.   MEDICATIONS: Continue present medications.  Please see handouts given to you by your recovery nurse: Diverticulosis.  FOLLOW UP:  Repeat colonoscopy is not recommended for screening purposes.  Thank you for allowing us  to provide for your healthcare needs today. ACTIVITY:  You should plan to take it easy for the rest of today and you should NOT DRIVE or use heavy machinery until tomorrow (because of the sedation medicines used during the test).    FOLLOW UP: Our staff will call the number listed on your records the next business day following your procedure.  We will call around 7:15- 8:00 am to check on you and address any questions or concerns that you may have regarding the information given to you following your procedure. If we do not reach you, we will leave a message.     If any biopsies were taken you will be contacted by phone or by letter within the next 1-3 weeks.  Please call us  at [phone] if you have not heard about the biopsies in 3 weeks.    SIGNATURES/CONFIDENTIALITY: You and/or your care partner have signed paperwork which will be entered into your electronic medical record.  These signatures attest to the fact that that the information above on your After Visit Summary has been reviewed and is understood.  Full responsibility of the confidentiality of this discharge information lies with you and/or your care-partner.

## 2024-04-03 NOTE — Progress Notes (Signed)
PT taken to PACU. Monitors in place. VSS. Report given to RN. 

## 2024-04-03 NOTE — Progress Notes (Signed)
 Pt's states no medical or surgical changes since previsit or office visit.

## 2024-04-03 NOTE — Op Note (Signed)
 [facility] Patient Name: Kerri Osborne Procedure Date: 04/03/2024 9:05 AM MRN: [id number] Endoscopist: Norleen SAILOR. Abran , MD, [id number] Age: 78 Referring MD:  Date of Birth: 04-26-1946 Gender: Female Account #: [id number] Procedure:                Colonoscopy Indications:              Screening for colorectal malignant neoplasm.                            Previous examinations 2003, 2009, 2015 were                            negative for neoplasia Medicines:                Monitored Anesthesia Care Procedure:                Pre-Anesthesia Assessment:                           - Prior to the procedure, a History and Physical                            was performed, and patient medications and                            allergies were reviewed. The patient's tolerance of                            previous anesthesia was also reviewed. The risks                            and benefits of the procedure and the sedation                            options and risks were discussed with the patient.                            All questions were answered, and informed consent                            was obtained. Prior Anticoagulants: The patient has                            taken no anticoagulant or antiplatelet agents. ASA                            Grade Assessment: II - A patient with mild systemic                            disease. After reviewing the risks and benefits,                            the patient was deemed in satisfactory condition to  undergo the procedure.                           After obtaining informed consent, the colonoscope                            was passed under direct vision. Throughout the                            procedure, the patient's blood pressure, pulse, and                            oxygen saturations were monitored continuously. The                            CF [id number] #[id number] was introduced through the  anus                            and advanced to the the cecum, identified by                            appendiceal orifice and ileocecal valve. The                            ileocecal valve, appendiceal orifice, and rectum                            were photographed. The quality of the bowel                            preparation was excellent. The colonoscopy was                            performed without difficulty. The patient tolerated                            the procedure well. The bowel preparation used was                            SUPREP via split dose instruction. Scope In: 9:21:41 AM Scope Out: 9:38:02 AM Scope Withdrawal Time: 0 hours 10 minutes 0 seconds  Total Procedure Duration: 0 hours 16 minutes 21 seconds  Findings:                 Many diverticula were found in the entire colon.                           The exam was otherwise without abnormality on                            direct and retroflexion views. Complications:            No immediate complications. Estimated blood loss:  None. Estimated Blood Loss:     Estimated blood loss: none. Impression:               - Diverticulosis in the entire examined colon.                           - The examination was otherwise normal on direct                            and retroflexion views.                           - No specimens collected. Recommendation:           - Repeat colonoscopy is not recommended for                            screening purposes.                           - Patient has a contact number available for                            emergencies. The signs and symptoms of potential                            delayed complications were discussed with the                            patient. Return to normal activities tomorrow.                            Written discharge instructions were provided to the                            patient.                           -  Resume previous diet.                           - Continue present medications. Norleen SAILOR. Abran, MD 04/03/2024 9:41:59 AM This report has been signed electronically.

## 2024-04-04 ENCOUNTER — Telehealth: Payer: Self-pay

## 2024-04-04 NOTE — Telephone Encounter (Signed)
  Follow up Call-     04/03/2024    7:57 AM  Call back number  Post procedure Call Back phone  # [phone]  Permission to leave phone message Yes     Patient questions:  Do you have a fever, pain , or abdominal swelling? No. Pain Score  0 *  Have you tolerated food without any problems? Yes.    Have you been able to return to your normal activities? Yes.    Do you have any questions about your discharge instructions: Diet   No. Medications  No. Follow up visit  No.  Do you have questions or concerns about your Care? No.  Actions: * If pain score is 4 or above: No action needed, pain <4.

## 2024-09-03 ENCOUNTER — Other Ambulatory Visit: Payer: Self-pay | Admitting: Internal Medicine

## 2024-09-03 DIAGNOSIS — Z1231 Encounter for screening mammogram for malignant neoplasm of breast: Secondary | ICD-10-CM

## 2024-09-19 ENCOUNTER — Ambulatory Visit

## 2024-10-05 ENCOUNTER — Ambulatory Visit
Admission: RE | Admit: 2024-10-05 | Discharge: 2024-10-05 | Disposition: A | Source: Ambulatory Visit | Attending: Internal Medicine | Admitting: Internal Medicine

## 2024-10-05 DIAGNOSIS — Z1231 Encounter for screening mammogram for malignant neoplasm of breast: Secondary | ICD-10-CM

## 2024-10-08 ENCOUNTER — Ambulatory Visit

## 2024-10-12 ENCOUNTER — Ambulatory Visit

## 2024-10-17 ENCOUNTER — Inpatient Hospital Stay: Admission: RE | Admit: 2024-10-17 | Discharge: 2024-10-17 | Attending: Internal Medicine | Admitting: Internal Medicine
